# Patient Record
Sex: Female | Born: 1965 | Race: Black or African American | Hispanic: No | Smoking: Former smoker
Health system: Southern US, Community
[De-identification: ages and names within clinical notes are randomized; demographics above are authoritative.]

## PROBLEM LIST (undated history)

## (undated) DIAGNOSIS — J439 Emphysema, unspecified: Secondary | ICD-10-CM

## (undated) DIAGNOSIS — J45909 Unspecified asthma, uncomplicated: Secondary | ICD-10-CM

## (undated) HISTORY — PX: FOOT SURGERY: SHX648

---

## 2009-08-27 ENCOUNTER — Emergency Department (HOSPITAL_COMMUNITY): Admission: EM | Admit: 2009-08-27 | Discharge: 2009-08-27 | Payer: Self-pay | Admitting: Emergency Medicine

## 2010-07-11 ENCOUNTER — Encounter: Admission: RE | Admit: 2010-07-11 | Discharge: 2010-07-11 | Payer: Self-pay | Admitting: Family Medicine

## 2010-08-15 ENCOUNTER — Encounter: Admission: RE | Admit: 2010-08-15 | Discharge: 2010-08-15 | Payer: Self-pay | Admitting: Family Medicine

## 2010-09-03 ENCOUNTER — Encounter
Admission: RE | Admit: 2010-09-03 | Discharge: 2010-09-03 | Payer: Self-pay | Source: Home / Self Care | Attending: Family Medicine | Admitting: Family Medicine

## 2010-10-05 ENCOUNTER — Encounter: Payer: Self-pay | Admitting: Family Medicine

## 2010-12-03 ENCOUNTER — Encounter: Payer: Self-pay | Admitting: Family Medicine

## 2010-12-17 LAB — GC/CHLAMYDIA PROBE AMP, GENITAL: GC Probe Amp, Genital: NEGATIVE

## 2010-12-17 LAB — URINALYSIS, ROUTINE W REFLEX MICROSCOPIC
Bilirubin Urine: NEGATIVE
Glucose, UA: NEGATIVE mg/dL
Hgb urine dipstick: NEGATIVE
Protein, ur: NEGATIVE mg/dL
Urobilinogen, UA: 0.2 mg/dL (ref 0.0–1.0)

## 2010-12-17 LAB — WET PREP, GENITAL: Yeast Wet Prep HPF POC: NONE SEEN

## 2010-12-31 ENCOUNTER — Ambulatory Visit: Payer: Self-pay | Admitting: Hematology & Oncology

## 2011-01-17 ENCOUNTER — Ambulatory Visit: Payer: Self-pay | Admitting: Hematology & Oncology

## 2011-01-28 ENCOUNTER — Ambulatory Visit: Payer: Self-pay | Admitting: Hematology & Oncology

## 2011-02-26 ENCOUNTER — Ambulatory Visit (HOSPITAL_BASED_OUTPATIENT_CLINIC_OR_DEPARTMENT_OTHER): Payer: Non-veteran care | Admitting: Hematology & Oncology

## 2011-02-26 ENCOUNTER — Other Ambulatory Visit: Payer: Self-pay | Admitting: Hematology & Oncology

## 2011-02-26 DIAGNOSIS — D72819 Decreased white blood cell count, unspecified: Secondary | ICD-10-CM

## 2011-02-26 LAB — CBC WITH DIFFERENTIAL (CANCER CENTER ONLY)
BASO%: 0.7 % (ref 0.0–2.0)
Eosinophils Absolute: 0 10*3/uL (ref 0.0–0.5)
LYMPH%: 30 % (ref 14.0–48.0)
MCH: 32.2 pg (ref 26.0–34.0)
MCV: 94 fL (ref 81–101)
MONO%: 7.9 % (ref 0.0–13.0)
NEUT#: 1.8 10*3/uL (ref 1.5–6.5)
Platelets: 255 10*3/uL (ref 145–400)
RBC: 3.79 10*6/uL (ref 3.70–5.32)
RDW: 14.3 % (ref 11.1–15.7)
WBC: 3 10*3/uL — ABNORMAL LOW (ref 3.9–10.0)

## 2011-02-26 LAB — CHCC SATELLITE - SMEAR

## 2011-02-28 LAB — ANA: Anti Nuclear Antibody(ANA): NEGATIVE

## 2011-06-26 ENCOUNTER — Other Ambulatory Visit: Payer: Self-pay | Admitting: Hematology & Oncology

## 2011-06-26 ENCOUNTER — Encounter (HOSPITAL_BASED_OUTPATIENT_CLINIC_OR_DEPARTMENT_OTHER): Payer: Non-veteran care | Admitting: Hematology & Oncology

## 2011-06-26 DIAGNOSIS — D72819 Decreased white blood cell count, unspecified: Secondary | ICD-10-CM

## 2011-06-26 LAB — CBC WITH DIFFERENTIAL (CANCER CENTER ONLY)
BASO#: 0 10*3/uL (ref 0.0–0.2)
Eosinophils Absolute: 0.1 10*3/uL (ref 0.0–0.5)
HGB: 12.4 g/dL (ref 11.6–15.9)
LYMPH#: 1.3 10*3/uL (ref 0.9–3.3)
LYMPH%: 23 % (ref 14.0–48.0)
MCH: 33.6 pg (ref 26.0–34.0)
MCHC: 35.1 g/dL (ref 32.0–36.0)
NEUT#: 3.7 10*3/uL (ref 1.5–6.5)
NEUT%: 67.4 % (ref 39.6–80.0)
Platelets: 244 10*3/uL (ref 145–400)
RDW: 13.9 % (ref 11.1–15.7)

## 2014-07-04 ENCOUNTER — Emergency Department (HOSPITAL_COMMUNITY): Payer: Worker's Compensation

## 2014-07-04 ENCOUNTER — Emergency Department (HOSPITAL_COMMUNITY)
Admission: EM | Admit: 2014-07-04 | Discharge: 2014-07-04 | Disposition: A | Discharge status: Home / Self Care | Payer: Worker's Compensation | Attending: Emergency Medicine | Admitting: Emergency Medicine

## 2014-07-04 ENCOUNTER — Encounter (HOSPITAL_COMMUNITY): Payer: Self-pay | Admitting: Emergency Medicine

## 2014-07-04 DIAGNOSIS — Z720 Tobacco use: Secondary | ICD-10-CM | POA: Diagnosis not present

## 2014-07-04 DIAGNOSIS — S6991XA Unspecified injury of right wrist, hand and finger(s), initial encounter: Secondary | ICD-10-CM | POA: Diagnosis present

## 2014-07-04 DIAGNOSIS — Y9389 Activity, other specified: Secondary | ICD-10-CM | POA: Diagnosis not present

## 2014-07-04 DIAGNOSIS — Z79899 Other long term (current) drug therapy: Secondary | ICD-10-CM | POA: Insufficient documentation

## 2014-07-04 DIAGNOSIS — S61011A Laceration without foreign body of right thumb without damage to nail, initial encounter: Secondary | ICD-10-CM

## 2014-07-04 DIAGNOSIS — W231XXA Caught, crushed, jammed, or pinched between stationary objects, initial encounter: Secondary | ICD-10-CM | POA: Insufficient documentation

## 2014-07-04 DIAGNOSIS — Y998 Other external cause status: Secondary | ICD-10-CM | POA: Insufficient documentation

## 2014-07-04 DIAGNOSIS — Y9269 Other specified industrial and construction area as the place of occurrence of the external cause: Secondary | ICD-10-CM | POA: Insufficient documentation

## 2014-07-04 LAB — RAPID URINE DRUG SCREEN, HOSP PERFORMED
Amphetamines: NOT DETECTED
Barbiturates: NOT DETECTED
Benzodiazepines: NOT DETECTED
COCAINE: NOT DETECTED
OPIATES: NOT DETECTED
TETRAHYDROCANNABINOL: NOT DETECTED

## 2014-07-04 MED ORDER — HYDROCODONE-ACETAMINOPHEN 5-325 MG PO TABS: 2.0000 | ORAL_TABLET | ORAL | Status: DC | PRN

## 2014-07-04 MED ORDER — LIDOCAINE-EPINEPHRINE 2 %-1:100000 IJ SOLN: 1.7000 mL | Freq: Once | INTRAMUSCULAR | Status: DC

## 2014-07-04 MED ORDER — LIDOCAINE-EPINEPHRINE 1 %-1:100000 IJ SOLN
1.7000 mL | Freq: Once | INTRAMUSCULAR | Status: AC
  Administered 2014-07-04: 1.7 mL via INTRADERMAL
  Filled 2014-07-04: qty 1

## 2014-07-04 MED ORDER — LIDOCAINE HCL (CARDIAC) 20 MG/ML IV SOLN
INTRAVENOUS | Status: DC
Start: 2014-07-04 — End: 2014-07-04
  Filled 2014-07-04: qty 5

## 2014-07-04 NOTE — ED Provider Notes (Signed)
CSN: [id number]     Arrival date & time 07/04/14  0235 History   First MD Initiated Contact with Patient 07/04/14 0407     Chief Complaint  Patient presents with  . Finger Injury     (Consider location/radiation/quality/duration/timing/severity/associated sxs/prior Treatment) HPI 48 year old female presents to the emergency department from her place of work with right thumb injury.  Patient reports that a hook-like Radic device that is in the machinery caught her thumb.  She reports her tetanus is up-to-date. History reviewed. No pertinent past medical history. Past Surgical History  Procedure Laterality Date  . Cesarean section    . Foot surgery     No family history on file. History  Substance Use Topics  . Smoking status: Current Every Day Smoker  . Smokeless tobacco: Not on file  . Alcohol Use: No   OB History   Grav Para Term Preterm Abortions TAB SAB Ect Mult Living                 Review of Systems  See History of Present Illness; otherwise all other systems are reviewed and negative   Allergies  Review of patient's allergies indicates no known allergies.  Home Medications   Prior to Admission medications   Medication Sig Start Date End Date Taking? Authorizing Provider  ibuprofen (ADVIL,MOTRIN) 800 MG tablet Take 800 mg by mouth every 8 (eight) hours as needed for moderate pain.   Yes Historical Provider, MD  Multiple Vitamins-Minerals (ALIVE WOMENS 50+ PO) Take 1 tablet by mouth daily.   Yes Historical Provider, MD  HYDROcodone-acetaminophen (NORCO/VICODIN) 5-325 MG per tablet Take 2 tablets by mouth every 4 (four) hours as needed for moderate pain or severe pain. 07/04/14   Olivia Mackielga M Demaris Leavell, MD   BP 116/75  Pulse 76  Temp(Src) 98.2 F (36.8 C) (Oral)  Resp 18  Ht 5\' 6"  (1.676 m)  Wt 155 lb (70.308 kg)  BMI 25.03 kg/m2  SpO2 100%  LMP 06/21/2014 Physical Exam  Nursing note and vitals reviewed. Constitutional: She appears well-developed and  well-nourished. No distress.  Musculoskeletal: Normal range of motion. She exhibits tenderness. She exhibits no edema.  The patient has a 2 cm flap of old shunt to the pad of her right thumb.  Bleeding is controlled.  Sensation is normal.  Range of motion is normal.  Refill is less than 2 seconds    ED Course  Procedures (including critical care time) Labs Review Labs Reviewed  URINE RAPID DRUG SCREEN (HOSP PERFORMED)    Imaging Review Dg Finger Thumb Right  07/04/2014   CLINICAL DATA:  Jamming injury to the right thumb. Pain around the IP joint.  EXAM: RIGHT THUMB 2+V  COMPARISON:  None.  FINDINGS: There is no evidence of fracture or dislocation. There is no evidence of arthropathy or other focal bone abnormality. Soft tissues are unremarkable  IMPRESSION: Negative.   Electronically Signed   By: Burman NievesWilliam  Stevens M.D.   On: 07/04/2014 03:14     EKG Interpretation None     LACERATION REPAIR Performed by: Olivia MackieTTER,Sehaj Kolden M Authorized by: Olivia MackieTTER,Darrelle Barrell M Consent: Verbal consent obtained. Risks and benefits: risks, benefits and alternatives were discussed Consent given by: patient Patient identity confirmed: provided demographic data Prepped and Draped in normal sterile fashion Wound explored  Laceration Location: Right palmar thumb  Laceration Length:2cm  No Foreign Bodies seen or palpated  Anesthesia: local infiltration  Local anesthetic: lidocaine2% with epinephrine  Anesthetic total: 2 ml  Irrigation method: syringe Amount  of cleaning: standard  Skin closure: 5.0 Vicryl   Number of sutures: 6   Technique: Simple interrupted   Patient tolerance: Patient tolerated the procedure well with no immediate complications.  MDM   Final diagnoses:  Thumb laceration, right, initial encounter    48 year old female with right thumb laceration.  Patient had a flap-like avulsion.  Avulsion tacked down with sutures.  Patient advised that the avulsion flap may not be viable, but  will be a good bandage for the underlying abdominal.  She is to return to the ER for suture removal if they have not fallen on her own in 5-7 days.    Olivia Mackielga M Gillie Crisci, MD 07/04/14 [phone]

## 2014-07-04 NOTE — ED Notes (Signed)
Pt. 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

## 2014-07-04 NOTE — Discharge Instructions (Signed)

## 2014-07-04 NOTE — ED Notes (Signed)
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

## 2014-07-10 ENCOUNTER — Encounter (HOSPITAL_COMMUNITY): Payer: Self-pay | Admitting: Emergency Medicine

## 2014-07-10 ENCOUNTER — Emergency Department (HOSPITAL_COMMUNITY)
Admission: EM | Admit: 2014-07-10 | Discharge: 2014-07-10 | Disposition: A | Discharge status: Home / Self Care | Payer: Worker's Compensation | Attending: Emergency Medicine | Admitting: Emergency Medicine

## 2014-07-10 DIAGNOSIS — Y9269 Other specified industrial and construction area as the place of occurrence of the external cause: Secondary | ICD-10-CM | POA: Diagnosis not present

## 2014-07-10 DIAGNOSIS — Y990 Civilian activity done for income or pay: Secondary | ICD-10-CM | POA: Diagnosis not present

## 2014-07-10 DIAGNOSIS — Z720 Tobacco use: Secondary | ICD-10-CM | POA: Diagnosis not present

## 2014-07-10 DIAGNOSIS — W458XXD Other foreign body or object entering through skin, subsequent encounter: Secondary | ICD-10-CM | POA: Insufficient documentation

## 2014-07-10 DIAGNOSIS — Z79899 Other long term (current) drug therapy: Secondary | ICD-10-CM | POA: Diagnosis not present

## 2014-07-10 DIAGNOSIS — S61011D Laceration without foreign body of right thumb without damage to nail, subsequent encounter: Secondary | ICD-10-CM | POA: Insufficient documentation

## 2014-07-10 DIAGNOSIS — M79641 Pain in right hand: Secondary | ICD-10-CM | POA: Diagnosis present

## 2014-07-10 DIAGNOSIS — Y9389 Activity, other specified: Secondary | ICD-10-CM | POA: Diagnosis not present

## 2014-07-10 MED ORDER — CEPHALEXIN 500 MG PO CAPS: 500.0000 mg | ORAL_CAPSULE | Freq: Four times a day (QID) | ORAL | Status: DC

## 2014-07-10 NOTE — ED Notes (Signed)
Pt states repair to R thumb laceration with 6 sutures on 10/20.  She has been placed on light duty, but feels that is still too much for the injury she sustained.  Pain 10/10.  No s/s infection at this time.

## 2014-07-10 NOTE — Discharge Instructions (Signed)
Read the information below.  Use the prescribed medication as directed.  Please discuss all new medications with your pharmacist.  You may return to the Emergency Department at any time for worsening condition or any new symptoms that concern you.  If you develop redness, swelling, pus draining from the wound, or fevers greater than 100.4, return to the ER immediately for a recheck.     Laceration Care, Adult A laceration is a cut that goes through all layers of the skin. The cut goes into the tissue beneath the skin. HOME CARE For stitches (sutures) or staples:  Keep the cut clean and dry.  If you have a bandage (dressing), change it at least once a day. Change the bandage if it gets wet or dirty, or as told by your doctor.  Wash the cut with soap and water 2 times a day. Rinse the cut with water. Pat it dry with a clean towel.  Put a thin layer of medicated cream on the cut as told by your doctor.  You may shower after the first 24 hours. Do not soak the cut in water until the stitches are removed.  Only take medicines as told by your doctor.  Have your stitches or staples removed as told by your doctor. For skin adhesive strips:  Keep the cut clean and dry.  Do not get the strips wet. You may take a bath, but be careful to keep the cut dry.  If the cut gets wet, pat it dry with a clean towel.  The strips will fall off on their own. Do not remove the strips that are still stuck to the cut. For wound glue:  You may shower or take baths. Do not soak or scrub the cut. Do not swim. Avoid heavy sweating until the glue falls off on its own. After a shower or bath, pat the cut dry with a clean towel.  Do not put medicine on your cut until the glue falls off.  If you have a bandage, do not put tape over the glue.  Avoid lots of sunlight or tanning lamps until the glue falls off. Put sunscreen on the cut for the first year to reduce your scar.  The glue will fall off on its own. Do  not pick at the glue. You may need a tetanus shot if:  You cannot remember when you had your last tetanus shot.  You have never had a tetanus shot. If you need a tetanus shot and you choose not to have one, you may get tetanus. Sickness from tetanus can be serious. GET HELP RIGHT AWAY IF:   Your pain does not get better with medicine.  Your arm, hand, leg, or foot loses feeling (numbness) or changes color.  Your cut is bleeding.  Your joint feels weak, or you cannot use your joint.  You have painful lumps on your body.  Your cut is red, puffy (swollen), or painful.  You have a red line on the skin near the cut.  You have yellowish-white fluid (pus) coming from the cut.  You have a fever.  You have a bad smell coming from the cut or bandage.  Your cut breaks open before or after stitches are removed.  You notice something coming out of the cut, such as wood or glass.  You cannot move a finger or toe. MAKE SURE YOU:   Understand these instructions.  Will watch your condition.  Will get help right away if you are  not doing well or get worse. Document Released: 02/18/2008 Document Revised: 11/24/2011 Document Reviewed: 02/25/2011 [facility] Patient Information 2015 [employer] [place], [place]. This information is not intended to replace advice given to you by your health care provider. Make sure you discuss any questions you have with your health care provider.

## 2014-07-10 NOTE — ED Provider Notes (Signed)
CSN: [id number]     Arrival date & time 07/10/14  0703 History   First MD Initiated Contact with Patient 07/10/14 0732     Chief Complaint  Patient presents with  . Hand Pain     (Consider location/radiation/quality/duration/timing/severity/associated sxs/prior Treatment) The history is provided by the patient.   Pt cut her thumb on a piece of equiopment at work on 10/20, has had difficult healing process because her work encouraged her to go back on light duty and she is having to use her hand 12 hours/day, which aggravates her wound.  She puts neosporin and a bandaid on it for her 12 hours shift, cleans with soap and water, and lets it air dry overnight.  Denies fevers, weakness or numbness of the finger.  Denies any change in the appearance except that it is no longer red.     History reviewed. No pertinent past medical history. Past Surgical History  Procedure Laterality Date  . Cesarean section    . Foot surgery     No family history on file. History  Substance Use Topics  . Smoking status: Current Every Day Smoker -- 1.00 packs/day    Types: Cigarettes  . Smokeless tobacco: Not on file  . Alcohol Use: No   OB History   Grav Para Term Preterm Abortions TAB SAB Ect Mult Living                 Review of Systems  Constitutional: Negative for fever.  Musculoskeletal: Negative for arthralgias.  Skin: Positive for wound. Negative for color change.  Allergic/Immunologic: Negative for immunocompromised state.  Neurological: Negative for weakness and numbness.  Hematological: Does not bruise/bleed easily.      Allergies  Review of patient's allergies indicates no known allergies.  Home Medications   Prior to Admission medications   Medication Sig Start Date End Date Taking? Authorizing Provider  HYDROcodone-acetaminophen (NORCO/VICODIN) 5-325 MG per tablet Take 2 tablets by mouth every 4 (four) hours as needed for moderate pain or severe pain. 07/04/14   Olivia Mackielga M Otter,  MD  ibuprofen (ADVIL,MOTRIN) 800 MG tablet Take 800 mg by mouth every 8 (eight) hours as needed for moderate pain.    Historical Provider, MD  Multiple Vitamins-Minerals (ALIVE WOMENS 50+ PO) Take 1 tablet by mouth daily.    Historical Provider, MD   BP 147/86  Pulse 64  Temp(Src) 98 F (36.7 C) (Oral)  Resp 18  Ht 5\' 6"  (1.676 m)  Wt 155 lb (70.308 kg)  BMI 25.03 kg/m2  SpO2 100%  LMP 07/04/2014 Physical Exam  Nursing note and vitals reviewed. Constitutional: She appears well-developed and well-nourished. No distress.  HENT:  Head: Normocephalic and atraumatic.  Neck: Neck supple.  Pulmonary/Chest: Effort normal.  Neurological: She is alert.  Skin: She is not diaphoretic.  Right ventral thumb with sutured laceration intact.  Skin somewhat macerated but no erythema, discharge, or warmth.  Full AROM of thumb, only slightly decreased secondary to edema.  Distal sensation intact, capillary refill < 2 seconds.     ED Course  Procedures (including critical care time) Labs Review Labs Reviewed - No data to display  Imaging Review No results found.   EKG Interpretation None      MDM   Final diagnoses:  Thumb laceration, right, subsequent encounter    Afebrile, nontoxic patient with right thumb laceration returns for wound check.  No e/o infection.  Skin is macerated.  Discussed wound care.  Pt given keflex for coverage  as wound care has not been optimal.  Neurovascularly intact.   D/C home with keflex. PCP follow up.  Discussed result, findings, treatment, and follow up  with patient.  Pt given return precautions.  Pt verbalizes understanding and agrees with plan.         Trixie Dredgemily Rashaun Wichert, PA-C 07/10/14 1326

## 2014-07-10 NOTE — ED Provider Notes (Signed)
Medical screening examination/treatment/procedure(s) were performed by non-physician practitioner and as supervising physician I was immediately available for consultation/collaboration.   EKG Interpretation None       Doug SouSam Naji Mehringer, MD 07/10/14 1728

## 2014-07-10 NOTE — ED Notes (Signed)
Patients thumb was cleaned, and dressed by this RN.

## 2014-07-13 ENCOUNTER — Emergency Department (HOSPITAL_COMMUNITY)
Admission: EM | Admit: 2014-07-13 | Discharge: 2014-07-13 | Disposition: A | Discharge status: Home / Self Care | Payer: Non-veteran care | Attending: Emergency Medicine | Admitting: Emergency Medicine

## 2014-07-13 ENCOUNTER — Encounter (HOSPITAL_COMMUNITY): Payer: Self-pay | Admitting: Emergency Medicine

## 2014-07-13 DIAGNOSIS — Z720 Tobacco use: Secondary | ICD-10-CM | POA: Insufficient documentation

## 2014-07-13 DIAGNOSIS — Z79899 Other long term (current) drug therapy: Secondary | ICD-10-CM | POA: Insufficient documentation

## 2014-07-13 DIAGNOSIS — Z4802 Encounter for removal of sutures: Secondary | ICD-10-CM | POA: Insufficient documentation

## 2014-07-13 NOTE — ED Notes (Signed)
6 sutures removed.

## 2014-07-13 NOTE — ED Provider Notes (Signed)
Medical screening examination/treatment/procedure(s) were performed by non-physician practitioner and as supervising physician I was immediately available for consultation/collaboration.  Flint MelterElliott L Kanyla Omeara, MD 07/13/14 [phone]

## 2014-07-13 NOTE — ED Notes (Signed)
Pt. is here for follow up check up on her right thumb laceration sutured here 3 days ago .

## 2014-07-13 NOTE — ED Provider Notes (Signed)
CSN: [id number]     Arrival date & time 07/13/14  [id number] History   First MD Initiated Contact with Patient 07/13/14 [phone]     Chief Complaint  Patient presents with  . Wound Check     (Consider location/radiation/quality/duration/timing/severity/associated sxs/prior Treatment) Patient is a 48 y.o. female presenting with wound check. The history is provided by the patient. No language interpreter was used.  Wound Check This is a new problem. The current episode started in the past 7 days. The problem occurs constantly. Nothing aggravates the symptoms. The treatment provided no relief.  discolored around sutures,  Possible early infection,  Nv and ns intact  History reviewed. No pertinent past medical history. Past Surgical History  Procedure Laterality Date  . Cesarean section    . Foot surgery     No family history on file. History  Substance Use Topics  . Smoking status: Current Every Day Smoker -- 1.00 packs/day    Types: Cigarettes  . Smokeless tobacco: Not on file  . Alcohol Use: No   OB History   Grav Para Term Preterm Abortions TAB SAB Ect Mult Living                 Review of Systems    Allergies  Review of patient's allergies indicates no known allergies.  Home Medications   Prior to Admission medications   Medication Sig Start Date End Date Taking? Authorizing Provider  cephALEXin (KEFLEX) 500 MG capsule Take 1 capsule (500 mg total) by mouth 4 (four) times daily. 07/10/14  Yes Trixie DredgeEmily West, PA-C  HYDROcodone-acetaminophen (NORCO/VICODIN) 5-325 MG per tablet Take 2 tablets by mouth every 4 (four) hours as needed for moderate pain or severe pain. 07/04/14  Yes Olivia Mackielga M Otter, MD  ibuprofen (ADVIL,MOTRIN) 800 MG tablet Take 800 mg by mouth every 8 (eight) hours as needed for moderate pain.   Yes Historical Provider, MD  Multiple Vitamin (MULTIVITAMIN WITH MINERALS) TABS tablet Take 1 tablet by mouth daily.   Yes Historical Provider, MD   BP 127/68  Pulse 55   Temp(Src) 97.7 F (36.5 C) (Oral)  Resp 14  Ht 5\' 6"  (1.676 m)  Wt 155 lb (70.308 kg)  BMI 25.03 kg/m2  SpO2 100%  LMP 06/21/2014 Physical Exam   ED Course  Procedures (including critical care time) Labs Review Labs Reviewed - No data to display  Imaging Review No results found.   EKG Interpretation None      MDM  Sutures have been in 9 days.   I think pt has had maximin benefit of sutures Sutures removed.    Final diagnoses:  Visit for suture removal    Continue wound care AVS    Elson AreasLeslie K Sofia, PA-C 07/13/14 0819  Elson AreasLeslie K Sofia, PA-C 07/13/14 [phone]

## 2014-07-13 NOTE — Discharge Instructions (Signed)

## 2016-10-20 ENCOUNTER — Encounter (HOSPITAL_COMMUNITY): Payer: Self-pay | Admitting: *Deleted

## 2016-10-20 DIAGNOSIS — F17210 Nicotine dependence, cigarettes, uncomplicated: Secondary | ICD-10-CM | POA: Insufficient documentation

## 2016-10-20 DIAGNOSIS — R200 Anesthesia of skin: Secondary | ICD-10-CM | POA: Diagnosis present

## 2016-10-20 DIAGNOSIS — G629 Polyneuropathy, unspecified: Secondary | ICD-10-CM | POA: Diagnosis not present

## 2016-10-20 LAB — CBC
HCT: 39.4 % (ref 36.0–46.0)
Hemoglobin: 13.6 g/dL (ref 12.0–15.0)
MCH: 33.5 pg (ref 26.0–34.0)
MCHC: 34.5 g/dL (ref 30.0–36.0)
MCV: 97 fL (ref 78.0–100.0)
PLATELETS: 217 10*3/uL (ref 150–400)
RBC: 4.06 MIL/uL (ref 3.87–5.11)
RDW: 12.9 % (ref 11.5–15.5)
WBC: 4 10*3/uL (ref 4.0–10.5)

## 2016-10-20 LAB — BASIC METABOLIC PANEL
Anion gap: 7 (ref 5–15)
BUN: 9 mg/dL (ref 6–20)
CALCIUM: 9 mg/dL (ref 8.9–10.3)
CHLORIDE: 109 mmol/L (ref 101–111)
CO2: 24 mmol/L (ref 22–32)
CREATININE: 0.85 mg/dL (ref 0.44–1.00)
GFR calc Af Amer: >60 mL/min (ref >60)
GFR calc non Af Amer: >60 mL/min (ref >60)
Glucose, Bld: 99 mg/dL (ref 65–99)
Potassium: 3.7 mmol/L (ref 3.5–5.1)
Sodium: 140 mmol/L (ref 135–145)

## 2016-10-20 LAB — CBG MONITORING, ED: Glucose-Capillary: 106 mg/dL — ABNORMAL HIGH (ref 65–99)

## 2016-10-20 NOTE — ED Triage Notes (Signed)
PT states that she feels "cold"; pt states that she has "pins and needles to her feet; states that her rt hand feels cold and ache; pt states that she feels like she cannot get warm; pt states that the feeling of cold woke her up; pt states that she thought it was stress but that she should get evaluated since it has been going on for awhile; no weakness noted; pt with normal sensation to both sides of her body; no slurred speech; pt ambulatory with steady gait

## 2016-10-21 ENCOUNTER — Emergency Department (HOSPITAL_COMMUNITY)
Admission: EM | Admit: 2016-10-21 | Discharge: 2016-10-21 | Disposition: A | Discharge status: Home / Self Care | Payer: Non-veteran care | Attending: Emergency Medicine | Admitting: Emergency Medicine

## 2016-10-21 DIAGNOSIS — G629 Polyneuropathy, unspecified: Secondary | ICD-10-CM

## 2016-10-21 LAB — URINALYSIS, ROUTINE W REFLEX MICROSCOPIC
Bilirubin Urine: NEGATIVE
Glucose, UA: NEGATIVE mg/dL
HGB URINE DIPSTICK: NEGATIVE
Ketones, ur: NEGATIVE mg/dL
Leukocytes, UA: NEGATIVE
NITRITE: NEGATIVE
Protein, ur: NEGATIVE mg/dL
SPECIFIC GRAVITY, URINE: 1.023 (ref 1.005–1.030)
pH: 6 (ref 5.0–8.0)

## 2016-10-21 MED ORDER — GABAPENTIN 100 MG PO CAPS: 100.0000 mg | ORAL_CAPSULE | Freq: Two times a day (BID) | ORAL | 0 refills | Status: DC | PRN

## 2016-10-21 NOTE — Discharge Instructions (Signed)
Read the information below.  Use the prescribed medication as directed.  Please discuss all new medications with your pharmacist.  You may return to the Emergency Department at any time for worsening condition or any new symptoms that concern you.    °

## 2016-10-21 NOTE — ED Provider Notes (Signed)
[facility]-EMERGENCY DEPT Provider Note   CSN: [id number] Arrival date & time: 10/20/16  2147     History   Chief Complaint Chief Complaint  Patient presents with  . Numbness    HPI Nancy Chang is a 51 y.o. female.  HPI   Pt presents with 1 year of bilateral lower extremity tingling, burning, stinging that is painful.  A few months ago she felt like her legs were hot and now they feel cold.  She also has some tingling in her bilateral hands.  The pain is worse with lying down.  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  She is prediabetic.  She smokes.  Denies ETOH use.  No known medical problems.  [facility]  Has tried aspirin and pain medication without improvement.   History reviewed. No pertinent past medical history.  There are no active problems to display for this patient.   Past Surgical History:  Procedure Laterality Date  . CESAREAN SECTION    . FOOT SURGERY      OB History    No data available       Home Medications    Prior to Admission medications   Medication Sig Start Date End Date Taking? Authorizing Provider  cholecalciferol (VITAMIN D) 1000 units tablet Take 1,000 Units by mouth daily.   Yes Historical Provider, MD  guaiFENesin (MUCINEX) 600 MG 12 hr tablet Take 600 mg by mouth 2 (two) times daily.   Yes Historical Provider, MD  ibuprofen (ADVIL,MOTRIN) 800 MG tablet Take 800 mg by mouth every 8 (eight) hours as needed for moderate pain.   Yes Historical Provider, MD  Multiple Vitamin (MULTIVITAMIN WITH MINERALS) TABS tablet Take 1 tablet by mouth daily.   Yes Historical Provider, MD  OVER THE COUNTER MEDICATION Take 30 mLs by mouth once.   Yes Historical Provider, MD  vitamin B-12 (CYANOCOBALAMIN) 1000 MCG tablet Take 1,000 mcg by mouth daily.   Yes Historical Provider, MD  vitamin C (ASCORBIC ACID) 250 MG tablet Take 250 mg by mouth daily.   Yes Historical Provider, MD  cephALEXin (KEFLEX) 500 MG  capsule Take 1 capsule (500 mg total) by mouth 4 (four) times daily. Patient not taking: Reported on 10/21/2016 07/10/14   Trixie Dredge, PA-C  gabapentin (NEURONTIN) 100 MG capsule Take 1 capsule (100 mg total) by mouth 2 (two) times daily as needed (pain). 10/21/16   Trixie Dredge, PA-C  HYDROcodone-acetaminophen (NORCO/VICODIN) 5-325 MG per tablet Take 2 tablets by mouth every 4 (four) hours as needed for moderate pain or severe pain. Patient not taking: Reported on 10/21/2016 07/04/14   Marisa Severin, MD    Family History No family history on file.  Social History Social History  Substance Use Topics  . Smoking status: Current Every Day Smoker    Packs/day: 1.00    Types: Cigarettes  . Smokeless tobacco: Never Used  . Alcohol use No     Allergies   Patient has no known allergies.   Review of Systems Review of Systems  All other systems reviewed and are negative.    Physical Exam Updated Vital Signs BP 107/75 (BP Location: Left Arm)   Pulse (!) 51   Temp 98.6 F (37 C) (Oral)   Resp 16   LMP 07/04/2014   SpO2 100%   Physical Exam  Constitutional: She appears well-developed and well-nourished.  HENT:  Head: Normocephalic and atraumatic.  Neck: Neck supple.  Cardiovascular:  Normal rate and regular rhythm.   Pulmonary/Chest: Effort normal.  Musculoskeletal: She exhibits no edema.  Bilateral lower legs and feet without erythema, edema, warmth, tenderness.  Strong distal pulses.  Color and temperature are normal.  Bilateral hands with normal coloration, normal sensation, full AROM.    Neurological: She is alert.  Nursing note and vitals reviewed.    ED Treatments / Results  Labs (all labs ordered are listed, but only abnormal results are displayed) Labs Reviewed  URINALYSIS, ROUTINE W REFLEX MICROSCOPIC - Abnormal; Notable for the following:       Result Value   Bacteria, UA RARE (*)    Squamous Epithelial / LPF 0-5 (*)    All other components within normal limits  CBG  MONITORING, ED - Abnormal; Notable for the following:    Glucose-Capillary 106 (*)    All other components within normal limits  BASIC METABOLIC PANEL  CBC    EKG  EKG Interpretation None       Radiology No results found.  Procedures Procedures (including critical care time)  Medications Ordered in ED Medications - No data to display   Initial Impression / Assessment and Plan / ED Course  I have reviewed the triage vital signs and the nursing notes.  Pertinent labs & imaging results that were available during my care of the patient were reviewed by me and considered in my medical decision making (see chart for details).  Clinical Course as of Oct 22 335  Tue Oct 21, 2016  0328 Pt checked on DEA database - no prescriptions.   [EW]    Clinical Course User Index [EW] Trixie DredgeEmily Marte Celani, PA-C    Afebrile, nontoxic patient with 1 year of peripheral neuropathy.  She is pre-diabetic and smokes.  No hx ETOH abuse.  No recent changes.  Exam reassuring.  Doubt significant PAD, PVD. No e/o DVT.  D/C home with short supply trial of gabapentin, PCP follow up.  Discussed result, findings, treatment, and follow up  with patient.  Pt given return precautions.  Pt verbalizes understanding and agrees with plan.       Final Clinical Impressions(s) / ED Diagnoses   Final diagnoses:  Neuropathy (HCC)    New Prescriptions New Prescriptions   GABAPENTIN (NEURONTIN) 100 MG CAPSULE    Take 1 capsule (100 mg total) by mouth 2 (two) times daily as needed (pain).     Trixie Dredgemily Tamyrah Burbage, PA-C 10/21/16 [phone]    Dione Boozeavid Glick, MD 10/21/16 [phone]

## 2017-06-20 ENCOUNTER — Encounter (HOSPITAL_COMMUNITY): Payer: Self-pay | Admitting: Emergency Medicine

## 2017-06-20 ENCOUNTER — Emergency Department (HOSPITAL_COMMUNITY)
Admission: EM | Admit: 2017-06-20 | Discharge: 2017-06-20 | Disposition: A | Discharge status: Home / Self Care | Payer: Non-veteran care | Attending: Emergency Medicine | Admitting: Emergency Medicine

## 2017-06-20 DIAGNOSIS — F17210 Nicotine dependence, cigarettes, uncomplicated: Secondary | ICD-10-CM | POA: Insufficient documentation

## 2017-06-20 DIAGNOSIS — Z113 Encounter for screening for infections with a predominantly sexual mode of transmission: Secondary | ICD-10-CM | POA: Diagnosis present

## 2017-06-20 LAB — URINALYSIS, ROUTINE W REFLEX MICROSCOPIC
BILIRUBIN URINE: NEGATIVE
GLUCOSE, UA: NEGATIVE mg/dL
Hgb urine dipstick: NEGATIVE
KETONES UR: NEGATIVE mg/dL
Leukocytes, UA: NEGATIVE
Nitrite: NEGATIVE
Protein, ur: NEGATIVE mg/dL
Specific Gravity, Urine: 1.025 (ref 1.005–1.030)
pH: 5 (ref 5.0–8.0)

## 2017-06-20 LAB — WET PREP, GENITAL
Sperm: NONE SEEN
Trich, Wet Prep: NONE SEEN
Yeast Wet Prep HPF POC: NONE SEEN

## 2017-06-20 LAB — PREGNANCY, URINE: Preg Test, Ur: NEGATIVE

## 2017-06-20 NOTE — ED Provider Notes (Signed)
[facility]-EMERGENCY DEPT Provider Note   CSN: [id number] Arrival date & time: 06/20/17  0224     History   Chief Complaint Chief Complaint  Patient presents with  . STD check    HPI Nancy Chang is a 51 y.o. female.  Patient presents to the ED with a chief complaint of requesting STD check.  She states that her partner told her she needed to be evaluated.  She denies any vaginal discharge, bleeding, or dysuria, but does states that it feels like her bladder is full.  She denies any fevers, chills, nausea, vomiting, or diarrhea.  There are no other associated symptoms or modifying factors.   The history is provided by the patient. No language interpreter was used.    History reviewed. No pertinent past medical history.  There are no active problems to display for this patient.   Past Surgical History:  Procedure Laterality Date  . CESAREAN SECTION    . FOOT SURGERY      OB History    No data available       Home Medications    Prior to Admission medications   Medication Sig Start Date End Date Taking? Authorizing Provider  cephALEXin (KEFLEX) 500 MG capsule Take 1 capsule (500 mg total) by mouth 4 (four) times daily. Patient not taking: Reported on 10/21/2016 07/10/14   Trixie Dredge, PA-C  cholecalciferol (VITAMIN D) 1000 units tablet Take 1,000 Units by mouth daily.    [provider]  gabapentin (NEURONTIN) 100 MG capsule Take 1 capsule (100 mg total) by mouth 2 (two) times daily as needed (pain). 10/21/16   Trixie Dredge, PA-C  guaiFENesin (MUCINEX) 600 MG 12 hr tablet Take 600 mg by mouth 2 (two) times daily.    [provider]  HYDROcodone-acetaminophen (NORCO/VICODIN) 5-325 MG per tablet Take 2 tablets by mouth every 4 (four) hours as needed for moderate pain or severe pain. Patient not taking: Reported on 10/21/2016 07/04/14   Marisa Severin, MD  ibuprofen (ADVIL,MOTRIN) 800 MG tablet Take 800 mg by mouth every 8 (eight) hours as needed for moderate pain.     [provider]  Multiple Vitamin (MULTIVITAMIN WITH MINERALS) TABS tablet Take 1 tablet by mouth daily.    [provider]  OVER THE COUNTER MEDICATION Take 30 mLs by mouth once.    [provider]  vitamin B-12 (CYANOCOBALAMIN) 1000 MCG tablet Take 1,000 mcg by mouth daily.    [provider]  vitamin C (ASCORBIC ACID) 250 MG tablet Take 250 mg by mouth daily.    [provider]    Family History No family history on file.  Social History Social History  Substance Use Topics  . Smoking status: Current Every Day Smoker    Packs/day: 1.00    Types: Cigarettes  . Smokeless tobacco: Never Used  . Alcohol use No     Allergies   Patient has no known allergies.   Review of Systems Review of Systems  All other systems reviewed and are negative.    Physical Exam Updated Vital Signs BP 124/84 (BP Location: Right Arm)   Pulse 75   Temp 97.8 F (36.6 C) (Oral)   Resp 18   Ht  (1.676 m)   Wt 77.1 kg (170 lb)   LMP 07/04/2014   SpO2 100%   BMI 27.44 kg/m   Physical Exam  Constitutional: She is oriented to person, place, and time. She appears well-developed and well-nourished.  HENT:  Head:  Normocephalic and atraumatic.  Eyes: Pupils are equal, round, and reactive to light. Conjunctivae and EOM are normal.  Neck: Normal range of motion. Neck supple.  Cardiovascular: Normal rate and regular rhythm.  Exam reveals no gallop and no friction rub.   No murmur heard. Pulmonary/Chest: Effort normal and breath sounds normal. No respiratory distress. She has no wheezes. She has no rales. She exhibits no tenderness.  Abdominal: Soft. Bowel sounds are normal. She exhibits no distension and no mass. There is no tenderness. There is no rebound and no guarding.  Genitourinary:  Genitourinary Comments: Pelvic exam chaperoned by female ER tech, no right or left adnexal tenderness, no uterine tenderness, no vaginal discharge, no bleeding, no  CMT or friability, no foreign body, no injury to the external genitalia, no other significant findings   Musculoskeletal: Normal range of motion. She exhibits no edema or tenderness.  Neurological: She is alert and oriented to person, place, and time.  Skin: Skin is warm and dry.  Psychiatric: She has a normal mood and affect. Her behavior is normal. Judgment and thought content normal.  Nursing note and vitals reviewed.    ED Treatments / Results  Labs (all labs ordered are listed, but only abnormal results are displayed) Labs Reviewed  WET PREP, GENITAL  URINALYSIS, ROUTINE W REFLEX MICROSCOPIC  PREGNANCY, URINE  GC/CHLAMYDIA PROBE AMP ([place]) NOT AT [facility]    EKG  EKG Interpretation None       Radiology No results found.  Procedures Procedures (including critical care time)  Medications Ordered in ED Medications - No data to display   Initial Impression / Assessment and Plan / ED Course  I have reviewed the triage vital signs and the nursing notes.  Pertinent labs & imaging results that were available during my care of the patient were reviewed by me and considered in my medical decision making (see chart for details).     Patient here for STD check.  Asymptomatic.  Offered treatment in case of positive test results, but patient states she would rather wait for the results.  VSS.  PCP follow-up.  Final Clinical Impressions(s) / ED Diagnoses   Final diagnoses:  Screening for STDs (sexually transmitted diseases)    New Prescriptions New Prescriptions   No medications on file     Roxy Horseman, Cordelia Poche 06/20/17 0329    Dione Booze, MD 06/20/17 [phone]

## 2017-06-20 NOTE — ED Triage Notes (Signed)
Per patient partner told she needed to get checked for STD.  Only symptom is bladder feel "fuller".

## 2017-06-22 LAB — GC/CHLAMYDIA PROBE AMP (~~LOC~~) NOT AT ARMC
Chlamydia: NEGATIVE
NEISSERIA GONORRHEA: NEGATIVE

## 2017-12-05 ENCOUNTER — Emergency Department (HOSPITAL_BASED_OUTPATIENT_CLINIC_OR_DEPARTMENT_OTHER)
Admission: EM | Admit: 2017-12-05 | Discharge: 2017-12-05 | Disposition: A | Discharge status: Home / Self Care | Payer: Non-veteran care | Attending: Emergency Medicine | Admitting: Emergency Medicine

## 2017-12-05 ENCOUNTER — Encounter (HOSPITAL_BASED_OUTPATIENT_CLINIC_OR_DEPARTMENT_OTHER): Payer: Self-pay | Admitting: Emergency Medicine

## 2017-12-05 ENCOUNTER — Other Ambulatory Visit: Payer: Self-pay

## 2017-12-05 DIAGNOSIS — M2550 Pain in unspecified joint: Secondary | ICD-10-CM | POA: Diagnosis not present

## 2017-12-05 DIAGNOSIS — F17210 Nicotine dependence, cigarettes, uncomplicated: Secondary | ICD-10-CM | POA: Diagnosis not present

## 2017-12-05 DIAGNOSIS — R200 Anesthesia of skin: Secondary | ICD-10-CM | POA: Diagnosis not present

## 2017-12-05 DIAGNOSIS — Z79899 Other long term (current) drug therapy: Secondary | ICD-10-CM | POA: Diagnosis not present

## 2017-12-05 DIAGNOSIS — M791 Myalgia, unspecified site: Secondary | ICD-10-CM

## 2017-12-05 DIAGNOSIS — J45909 Unspecified asthma, uncomplicated: Secondary | ICD-10-CM | POA: Insufficient documentation

## 2017-12-05 DIAGNOSIS — R600 Localized edema: Secondary | ICD-10-CM | POA: Diagnosis not present

## 2017-12-05 HISTORY — DX: Unspecified asthma, uncomplicated: J45.909

## 2017-12-05 LAB — CBC WITH DIFFERENTIAL/PLATELET
BASOS ABS: 0 10*3/uL (ref 0.0–0.1)
Basophils Relative: 1 %
EOS PCT: 3 %
Eosinophils Absolute: 0.1 10*3/uL (ref 0.0–0.7)
HEMATOCRIT: 41 % (ref 36.0–46.0)
Hemoglobin: 14 g/dL (ref 12.0–15.0)
LYMPHS ABS: 1.2 10*3/uL (ref 0.7–4.0)
Lymphocytes Relative: 30 %
MCH: 33.5 pg (ref 26.0–34.0)
MCHC: 34.1 g/dL (ref 30.0–36.0)
MCV: 98.1 fL (ref 78.0–100.0)
Monocytes Absolute: 0.4 10*3/uL (ref 0.1–1.0)
Monocytes Relative: 9 %
NEUTROS ABS: 2.3 10*3/uL (ref 1.7–7.7)
Neutrophils Relative %: 57 %
PLATELETS: 207 10*3/uL (ref 150–400)
RBC: 4.18 MIL/uL (ref 3.87–5.11)
RDW: 13.1 % (ref 11.5–15.5)
WBC: 3.9 10*3/uL — AB (ref 4.0–10.5)

## 2017-12-05 LAB — URINALYSIS, ROUTINE W REFLEX MICROSCOPIC
Bilirubin Urine: NEGATIVE
Glucose, UA: NEGATIVE mg/dL
Hgb urine dipstick: NEGATIVE
KETONES UR: NEGATIVE mg/dL
LEUKOCYTES UA: NEGATIVE
NITRITE: NEGATIVE
Protein, ur: NEGATIVE mg/dL
pH: 5.5 (ref 5.0–8.0)

## 2017-12-05 LAB — COMPREHENSIVE METABOLIC PANEL
ALK PHOS: 89 U/L (ref 38–126)
ALT: 14 U/L (ref 14–54)
ANION GAP: 6 (ref 5–15)
AST: 19 U/L (ref 15–41)
Albumin: 3.9 g/dL (ref 3.5–5.0)
BUN: 10 mg/dL (ref 6–20)
CO2: 23 mmol/L (ref 22–32)
Calcium: 9 mg/dL (ref 8.9–10.3)
Chloride: 111 mmol/L (ref 101–111)
Creatinine, Ser: 0.76 mg/dL (ref 0.44–1.00)
GFR calc Af Amer: >60 mL/min (ref >60)
GFR calc non Af Amer: >60 mL/min (ref >60)
Glucose, Bld: 87 mg/dL (ref 65–99)
POTASSIUM: 3.7 mmol/L (ref 3.5–5.1)
Sodium: 140 mmol/L (ref 135–145)
Total Bilirubin: 0.4 mg/dL (ref 0.3–1.2)
Total Protein: 6.9 g/dL (ref 6.5–8.1)

## 2017-12-05 LAB — SEDIMENTATION RATE: Sed Rate: 4 mm/hr (ref 0–22)

## 2017-12-05 LAB — CK: Total CK: 62 U/L (ref 38–234)

## 2017-12-05 LAB — TSH: TSH: 0.352 u[IU]/mL (ref 0.350–4.500)

## 2017-12-05 MED ORDER — TRAMADOL HCL 50 MG PO TABS
100.0000 mg | ORAL_TABLET | Freq: Once | ORAL | Status: AC
  Administered 2017-12-05: 100 mg via ORAL
  Filled 2017-12-05: qty 2

## 2017-12-05 MED ORDER — HYDROCODONE-ACETAMINOPHEN 5-325 MG PO TABS: 1.0000 | ORAL_TABLET | ORAL | 0 refills | Status: DC | PRN

## 2017-12-05 MED ORDER — DEXAMETHASONE SODIUM PHOSPHATE 10 MG/ML IJ SOLN
10.0000 mg | Freq: Once | INTRAMUSCULAR | Status: AC
  Administered 2017-12-05: 10 mg via INTRAMUSCULAR
  Filled 2017-12-05: qty 1

## 2017-12-05 MED ORDER — PREDNISONE 20 MG PO TABS: ORAL_TABLET | ORAL | 0 refills | Status: DC

## 2017-12-05 NOTE — Discharge Instructions (Signed)
1.  See your primary care provider at the [facility].  Discussed possible referral to a rheumatologist. 2.  You must quit smoking.  This could seriously worsen problems you are having with cold and hot episodes of your hands.

## 2017-12-05 NOTE — ED Provider Notes (Signed)
[facility] EMERGENCY DEPARTMENT Provider Note   CSN: [id number] Arrival date & time: 12/05/17  1124     History   Chief Complaint Chief Complaint  Patient presents with  . Joint Pain    HPI Nancy Chang is a 52 y.o. female.  HPI Patient reports she gets episodes of sharp pains that can occur in her hands or her feet.  Sometimes these radiate up into her extremities.  She also has episodes where her hands get very cold and numb.  At other times they are warm and red.  She experiences a lot of sharp and aching pains in her extremities.  No fevers, no chills.  No chest pain no shortness of breath.  No confusion.  Patient does smoke.  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  Patient reports in the past she was once told that she had neuropathy but she describes having had an EMG and then told that she did not have neuropathy. Past Medical History:  Diagnosis Date  . Asthma     There are no active problems to display for this patient.   Past Surgical History:  Procedure Laterality Date  . CESAREAN SECTION    . FOOT SURGERY       OB History   None      Home Medications    Prior to Admission medications   Medication Sig Start Date End Date Taking? Authorizing Provider  cephALEXin (KEFLEX) 500 MG capsule Take 1 capsule (500 mg total) by mouth 4 (four) times daily. Patient not taking: Reported on 10/21/2016 07/10/14   Trixie DredgeWest, Emily, PA-C  cholecalciferol (VITAMIN D) 1000 units tablet Take 1,000 Units by mouth daily.    [provider]  gabapentin (NEURONTIN) 100 MG capsule Take 1 capsule (100 mg total) by mouth 2 (two) times daily as needed (pain). 10/21/16   Trixie DredgeWest, Emily, PA-C  guaiFENesin (MUCINEX) 600 MG 12 hr tablet Take 600 mg by mouth 2 (two) times daily.    [provider]  HYDROcodone-acetaminophen (NORCO/VICODIN) 5-325 MG per tablet Take 2 tablets by mouth every 4 (four) hours as needed for moderate pain  or severe pain. Patient not taking: Reported on 10/21/2016 07/04/14   Marisa Severintter, Olga, MD  HYDROcodone-acetaminophen (NORCO/VICODIN) 5-325 MG tablet Take 1 tablet by mouth every 4 (four) hours as needed for moderate pain or severe pain. 12/05/17   Arby BarrettePfeiffer, Angeleen Horney, MD  ibuprofen (ADVIL,MOTRIN) 800 MG tablet Take 800 mg by mouth every 8 (eight) hours as needed for moderate pain.    [provider]  Multiple Vitamin (MULTIVITAMIN WITH MINERALS) TABS tablet Take 1 tablet by mouth daily.    [provider]  OVER THE COUNTER MEDICATION Take 30 mLs by mouth once.    [provider]  predniSONE (DELTASONE) 20 MG tablet 2 tabs po daily x 4 days 12/05/17   Arby BarrettePfeiffer, Sarin [place], MD  vitamin B-12 (CYANOCOBALAMIN) 1000 MCG tablet Take 1,000 mcg by mouth daily.    [provider]  vitamin C (ASCORBIC ACID) 250 MG tablet Take 250 mg by mouth daily.    [provider]    Family History No family history on file.  Social History Social History   Tobacco Use  . Smoking status: Current Every Day Smoker    Packs/day: 1.00    Types: Cigarettes  . Smokeless tobacco: Never Used  Substance Use Topics  . Alcohol use: No  . Drug use: No  Allergies   Patient has no known allergies.   Review of Systems Review of Systems 10 Systems reviewed and are negative for acute change except as noted in the HPI.   Physical Exam Updated Vital Signs BP 139/78 (BP Location: Right Arm)   Pulse (!) 58   Temp 98.1 F (36.7 C) (Oral)   Resp 16   Ht 5\' 6"  (1.676 m)   Wt 77.1 kg (170 lb)   LMP 07/04/2014   SpO2 100%   BMI 27.44 kg/m   Physical Exam  Constitutional: She is oriented to person, place, and time. She appears well-developed and well-nourished. No distress.  HENT:  Head: Normocephalic and atraumatic.  Nose: Nose normal.  Mouth/Throat: Oropharynx is clear and moist.  Eyes: Pupils are equal, round, and reactive to light. Conjunctivae and EOM are normal.  Neck: Neck  supple. No thyromegaly present.  Cardiovascular: Normal rate and regular rhythm.  No murmur heard. Pulmonary/Chest: Effort normal and breath sounds normal. No respiratory distress.  Abdominal: Soft. She exhibits no distension. There is no tenderness. There is no guarding.  Genitourinary:  Genitourinary Comments: Bilateral hands are slightly swollen.  Patient has erythematous quality to her palmar surfaces and digits.  Skin of the fingers seems slightly shiny and thinned.  There is no edema of the arms.  This is symmetric on both hands.  Feet this is less pronounced.  He does not have any actual edema of the feet.  Soles are slightly shiny and erythematous.  Musculoskeletal: Normal range of motion. She exhibits edema.  Lymphadenopathy:    She has no cervical adenopathy.  Neurological: She is alert and oriented to person, place, and time. No cranial nerve deficit. She exhibits normal muscle tone. Coordination normal.  Skin: Skin is warm and dry.  Psychiatric: She has a normal mood and affect.  Nursing note and vitals reviewed.    ED Treatments / Results  Labs (all labs ordered are listed, but only abnormal results are displayed) Labs Reviewed  CBC WITH DIFFERENTIAL/PLATELET - Abnormal; Notable for the following components:      Result Value   WBC 3.9 (*)    All other components within normal limits  URINALYSIS, ROUTINE W REFLEX MICROSCOPIC - Abnormal; Notable for the following components:   APPearance CLOUDY (*)    Specific Gravity, Urine >1.030 (*)    All other components within normal limits  COMPREHENSIVE METABOLIC PANEL  SEDIMENTATION RATE  CK  TSH    EKG None  Radiology No results found.  Procedures Procedures (including critical care time)  Medications Ordered in ED Medications  dexamethasone (DECADRON) injection 10 mg (10 mg Intramuscular Given 12/05/17 1233)  traMADol (ULTRAM) tablet 100 mg (100 mg Oral Given 12/05/17 1233)     Initial Impression / Assessment  and Plan / ED Course  I have reviewed the triage vital signs and the nursing notes.  Pertinent labs & imaging results that were available during my care of the patient were reviewed by me and considered in my medical decision making (see chart for details).     Final Clinical Impressions(s) / ED Diagnoses   Final diagnoses:  Myalgia  Arthralgia, unspecified joint   Patient has complaints of pains that are in all 4 extremities.  Pain quality is sometimes aching and lancinating.  Reports that her hands and feet used to be more numb and tingly but now there are more pain episodes.  Sed rate is normal and CK is normal.  Patient does not have appearance  of general illness.  Hands do have somewhat more of an erythematous and slightly swollen appearance of the digits.  She describes episodes of extreme cold of the hands and then swelling.  Question diagnoses such as Raynaud's or Buerger syndrome.  Patient is a smoker.  Patient is counseled on smoking cessation and how important this is.  I suggest the patient follow up with her primary care provider at the [facility] and discuss evaluation by rheumatology.  This time she is stable for discharge.  Patient will have a short course of prednisone and Vicodin for arthralgia pain. ED Discharge Orders        Ordered    predniSONE (DELTASONE) 20 MG tablet     12/05/17 1536    HYDROcodone-acetaminophen (NORCO/VICODIN) 5-325 MG tablet  Every 4 hours PRN     12/05/17 1536       Arby Barrette, MD 12/05/17 1545

## 2017-12-05 NOTE — ED Triage Notes (Signed)
Pt reports generalized joint pain for "awhile", worse today. Swelling and redness noted to both hands.

## 2018-07-01 ENCOUNTER — Emergency Department (HOSPITAL_COMMUNITY)
Admission: EM | Admit: 2018-07-01 | Discharge: 2018-07-01 | Disposition: A | Discharge status: Home / Self Care | Payer: Non-veteran care | Attending: Emergency Medicine | Admitting: Emergency Medicine

## 2018-07-01 ENCOUNTER — Encounter (HOSPITAL_COMMUNITY): Payer: Self-pay

## 2018-07-01 ENCOUNTER — Other Ambulatory Visit: Payer: Self-pay

## 2018-07-01 DIAGNOSIS — S199XXA Unspecified injury of neck, initial encounter: Secondary | ICD-10-CM | POA: Diagnosis present

## 2018-07-01 DIAGNOSIS — Y999 Unspecified external cause status: Secondary | ICD-10-CM | POA: Insufficient documentation

## 2018-07-01 DIAGNOSIS — Y929 Unspecified place or not applicable: Secondary | ICD-10-CM | POA: Insufficient documentation

## 2018-07-01 DIAGNOSIS — F17210 Nicotine dependence, cigarettes, uncomplicated: Secondary | ICD-10-CM | POA: Insufficient documentation

## 2018-07-01 DIAGNOSIS — S161XXA Strain of muscle, fascia and tendon at neck level, initial encounter: Secondary | ICD-10-CM | POA: Diagnosis not present

## 2018-07-01 DIAGNOSIS — J45909 Unspecified asthma, uncomplicated: Secondary | ICD-10-CM | POA: Diagnosis not present

## 2018-07-01 DIAGNOSIS — Y939 Activity, unspecified: Secondary | ICD-10-CM | POA: Diagnosis not present

## 2018-07-01 DIAGNOSIS — T148XXA Other injury of unspecified body region, initial encounter: Secondary | ICD-10-CM

## 2018-07-01 DIAGNOSIS — Z79899 Other long term (current) drug therapy: Secondary | ICD-10-CM | POA: Insufficient documentation

## 2018-07-01 MED ORDER — IBUPROFEN 800 MG PO TABS: 800.0000 mg | ORAL_TABLET | Freq: Three times a day (TID) | ORAL | 0 refills | Status: AC | PRN

## 2018-07-01 MED ORDER — CYCLOBENZAPRINE HCL 10 MG PO TABS: 10.0000 mg | ORAL_TABLET | Freq: Two times a day (BID) | ORAL | 0 refills | Status: DC | PRN

## 2018-07-01 NOTE — ED Notes (Signed)
D/c reviewed with patient 

## 2018-07-01 NOTE — ED Notes (Signed)
ED Provider at bedside. 
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
## 2018-07-01 NOTE — ED Triage Notes (Signed)
Pt endorses left neck pain and right hip pain intermittent x 1 week. Denies CP or cardiac sx. VSS

## 2018-09-21 ENCOUNTER — Other Ambulatory Visit: Payer: Self-pay

## 2018-09-21 ENCOUNTER — Encounter (HOSPITAL_COMMUNITY): Payer: Self-pay

## 2018-09-21 ENCOUNTER — Emergency Department (HOSPITAL_COMMUNITY)
Admission: EM | Admit: 2018-09-21 | Discharge: 2018-09-21 | Disposition: A | Discharge status: Home / Self Care | Payer: No Typology Code available for payment source | Attending: Emergency Medicine | Admitting: Emergency Medicine

## 2018-09-21 DIAGNOSIS — R1030 Lower abdominal pain, unspecified: Secondary | ICD-10-CM | POA: Diagnosis not present

## 2018-09-21 DIAGNOSIS — R102 Pelvic and perineal pain: Secondary | ICD-10-CM | POA: Diagnosis not present

## 2018-09-21 DIAGNOSIS — J45909 Unspecified asthma, uncomplicated: Secondary | ICD-10-CM | POA: Insufficient documentation

## 2018-09-21 DIAGNOSIS — Z79899 Other long term (current) drug therapy: Secondary | ICD-10-CM | POA: Insufficient documentation

## 2018-09-21 DIAGNOSIS — F17210 Nicotine dependence, cigarettes, uncomplicated: Secondary | ICD-10-CM | POA: Diagnosis not present

## 2018-09-21 DIAGNOSIS — R109 Unspecified abdominal pain: Secondary | ICD-10-CM | POA: Diagnosis present

## 2018-09-21 LAB — URINALYSIS, ROUTINE W REFLEX MICROSCOPIC
BILIRUBIN URINE: NEGATIVE
Glucose, UA: NEGATIVE mg/dL
HGB URINE DIPSTICK: NEGATIVE
Ketones, ur: NEGATIVE mg/dL
LEUKOCYTES UA: NEGATIVE
NITRITE: NEGATIVE
PH: 5 (ref 5.0–8.0)
Protein, ur: NEGATIVE mg/dL
SPECIFIC GRAVITY, URINE: 1.023 (ref 1.005–1.030)

## 2018-09-21 LAB — COMPREHENSIVE METABOLIC PANEL
ALT: 18 U/L (ref 0–44)
AST: 20 U/L (ref 15–41)
Albumin: 4 g/dL (ref 3.5–5.0)
Alkaline Phosphatase: 97 U/L (ref 38–126)
Anion gap: 10 (ref 5–15)
BUN: 10 mg/dL (ref 6–20)
CALCIUM: 9.7 mg/dL (ref 8.9–10.3)
CO2: 25 mmol/L (ref 22–32)
Chloride: 103 mmol/L (ref 98–111)
Creatinine, Ser: 0.85 mg/dL (ref 0.44–1.00)
GFR calc Af Amer: >60 mL/min (ref >60)
GFR calc non Af Amer: >60 mL/min (ref >60)
Glucose, Bld: 89 mg/dL (ref 70–99)
Potassium: 3.5 mmol/L (ref 3.5–5.1)
Sodium: 138 mmol/L (ref 135–145)
Total Bilirubin: 0.7 mg/dL (ref 0.3–1.2)
Total Protein: 7 g/dL (ref 6.5–8.1)

## 2018-09-21 LAB — I-STAT BETA HCG BLOOD, ED (MC, WL, AP ONLY)

## 2018-09-21 LAB — CBC
HCT: 41.5 % (ref 36.0–46.0)
Hemoglobin: 13.8 g/dL (ref 12.0–15.0)
MCH: 33.3 pg (ref 26.0–34.0)
MCHC: 33.3 g/dL (ref 30.0–36.0)
MCV: 100.2 fL — AB (ref 80.0–100.0)
PLATELETS: 228 10*3/uL (ref 150–400)
RBC: 4.14 MIL/uL (ref 3.87–5.11)
RDW: 12.9 % (ref 11.5–15.5)
WBC: 5.3 10*3/uL (ref 4.0–10.5)
nRBC: 0 % (ref 0.0–0.2)

## 2018-09-21 LAB — LIPASE, BLOOD: Lipase: 32 U/L (ref 11–51)

## 2018-09-21 MED ORDER — DOXYCYCLINE HYCLATE 100 MG PO CAPS: 100.0000 mg | ORAL_CAPSULE | Freq: Two times a day (BID) | ORAL | 0 refills | Status: DC

## 2018-09-21 MED ORDER — CEFTRIAXONE SODIUM 250 MG IJ SOLR
250.0000 mg | Freq: Once | INTRAMUSCULAR | Status: AC
  Administered 2018-09-21: 250 mg via INTRAMUSCULAR
  Filled 2018-09-21: qty 250

## 2018-09-21 MED ORDER — LIDOCAINE HCL (PF) 1 % IJ SOLN
INTRAMUSCULAR | Status: AC
  Administered 2018-09-21: 5 mL
  Filled 2018-09-21: qty 5

## 2018-09-21 MED ORDER — AZITHROMYCIN 1 G PO PACK
1.0000 g | PACK | Freq: Once | ORAL | Status: AC
  Administered 2018-09-21: 1 g via ORAL
  Filled 2018-09-21: qty 1

## 2018-09-21 NOTE — ED Provider Notes (Signed)
[facility] Note   CSN: [id number] Arrival date & time: 09/21/18  0003     History   Chief Complaint Chief Complaint  Patient presents with  . Abdominal Pain    HPI Nancy Chang is a 53 y.o. female.  Patient complains of lower abdominal pain for the last few weeks after having intercourse.  No obvious discharge  The history is provided by the patient. No language interpreter was used.  Abdominal Pain  Pain location:  Suprapubic Pain quality: aching   Pain radiates to:  Does not radiate Pain severity:  Moderate Onset quality:  Gradual Timing:  Constant Progression:  Waxing and waning Chronicity:  New Context: not alcohol use   Relieved by:  Nothing Worsened by:  Nothing Associated symptoms: no chest pain, no cough, no diarrhea, no fatigue and no hematuria     Past Medical History:  Diagnosis Date  . Asthma     There are no active problems to display for this patient.   Past Surgical History:  Procedure Laterality Date  . CESAREAN SECTION    . FOOT SURGERY       OB History   No obstetric history on file.      Home Medications    Prior to Admission medications   Medication Sig Start Date End Date Taking? Authorizing Provider  cholecalciferol (VITAMIN D) 1000 units tablet Take 1,000 Units by mouth daily.    [provider]  cyclobenzaprine (FLEXERIL) 10 MG tablet Take 1 tablet (10 mg total) by mouth 2 (two) times daily as needed for muscle spasms. 07/01/18   Fayrene Helperran, Bowie, PA-C  doxycycline (VIBRAMYCIN) 100 MG capsule Take 1 capsule (100 mg total) by mouth 2 (two) times daily. One po bid x 7 days 09/21/18   Bethann BerkshireZammit, Dail Meece, MD  gabapentin (NEURONTIN) 100 MG capsule Take 1 capsule (100 mg total) by mouth 2 (two) times daily as needed (pain). 10/21/16   Trixie DredgeWest, Emily, PA-C  guaiFENesin (MUCINEX) 600 MG 12 hr tablet Take 600 mg by mouth 2 (two) times daily.    [provider]  ibuprofen (ADVIL,MOTRIN)  800 MG tablet Take 1 tablet (800 mg total) by mouth every 8 (eight) hours as needed for moderate pain. 07/01/18   Fayrene Helperran, Bowie, PA-C  Multiple Vitamin (MULTIVITAMIN WITH MINERALS) TABS tablet Take 1 tablet by mouth daily.    [provider]  OVER THE COUNTER MEDICATION Take 30 mLs by mouth once.    [provider]  predniSONE (DELTASONE) 20 MG tablet 2 tabs po daily x 4 days 12/05/17   Arby BarrettePfeiffer, Marcy, MD  vitamin B-12 (CYANOCOBALAMIN) 1000 MCG tablet Take 1,000 mcg by mouth daily.    [provider]  vitamin C (ASCORBIC ACID) 250 MG tablet Take 250 mg by mouth daily.    [provider]    Family History History reviewed. No pertinent family history.  Social History Social History   Tobacco Use  . Smoking status: Current Every Day Smoker    Packs/day: 1.00    Types: Cigarettes  . Smokeless tobacco: Never Used  Substance Use Topics  . Alcohol use: No  . Drug use: No     Allergies   Patient has no known allergies.   Review of Systems Review of Systems  Constitutional: Negative for appetite change and fatigue.  HENT: Negative for congestion, ear discharge and sinus pressure.   Eyes: Negative for discharge.  Respiratory: Negative for cough.   Cardiovascular: Negative for  chest pain.  Gastrointestinal: Positive for abdominal pain. Negative for diarrhea.  Genitourinary: Negative for frequency and hematuria.  Musculoskeletal: Negative for back pain.  Skin: Negative for rash.  Neurological: Negative for seizures and headaches.  Psychiatric/Behavioral: Negative for hallucinations.     Physical Exam Updated Vital Signs BP (!) 151/82 (BP Location: Left Arm)   Pulse 60   Temp 98.2 F (36.8 C) (Oral)   Resp 16   Ht 5\' 6"  (1.676 m)   Wt 74.4 kg   LMP 07/04/2014   SpO2 100%   BMI 26.47 kg/m   Physical Exam Vitals signs reviewed.  Constitutional:      Appearance: She is well-developed.  HENT:     Head: Normocephalic.  Eyes:      General: No scleral icterus.    Conjunctiva/sclera: Conjunctivae normal.  Neck:     Musculoskeletal: Neck supple.     Thyroid: No thyromegaly.  Cardiovascular:     Rate and Rhythm: Normal rate and regular rhythm.     Heart sounds: No murmur. No friction rub. No gallop.   Pulmonary:     Breath sounds: No stridor. No wheezing or rales.  Chest:     Chest wall: No tenderness.  Abdominal:     General: There is no distension.     Tenderness: There is no abdominal tenderness. There is no rebound.  Genitourinary:    Comments: Tender cervix and adnexal area Musculoskeletal: Normal range of motion.  Lymphadenopathy:     Cervical: No cervical adenopathy.  Skin:    Findings: No erythema or rash.  Neurological:     Mental Status: She is oriented to person, place, and time.     Motor: No abnormal muscle tone.     Coordination: Coordination normal.  Psychiatric:        Behavior: Behavior normal.      ED Treatments / Results  Labs (all labs ordered are listed, but only abnormal results are displayed) Labs Reviewed  URINALYSIS, ROUTINE W REFLEX MICROSCOPIC - Abnormal; Notable for the following components:      Result Value   Color, Urine AMBER (*)    APPearance HAZY (*)    Bacteria, UA RARE (*)    All other components within normal limits  CBC - Abnormal; Notable for the following components:   MCV 100.2 (*)    All other components within normal limits  LIPASE, BLOOD  COMPREHENSIVE METABOLIC PANEL  I-STAT BETA HCG BLOOD, ED ([facility], [facility], [facility] ONLY)  GC/CHLAMYDIA PROBE AMP ([place]) NOT AT [facility]    EKG None  Radiology No results found.  Procedures Procedures (including critical care time)  Medications Ordered in ED Medications  cefTRIAXone (ROCEPHIN) injection 250 mg (has no administration in time range)  azithromycin (ZITHROMAX) powder 1 g (has no administration in time range)     Initial Impression / Assessment and Plan / ED Course  I have reviewed the triage vital  signs and the nursing notes.  Pertinent labs & imaging results that were available during my care of the patient were reviewed by me and considered in my medical decision making (see chart for details).    Patient with pelvic pain.  She will be treated empirically for STD and follow-up with women's clinic  Final Clinical Impressions(s) / ED Diagnoses   Final diagnoses:  Lower abdominal pain    ED Discharge Orders         Ordered    doxycycline (VIBRAMYCIN) 100 MG capsule  2 times daily  09/21/18 4166           Bethann Berkshire, MD 09/21/18 [phone]

## 2018-09-21 NOTE — ED Triage Notes (Signed)
Pt arrives POV for eval of lower abd pain onset early December. Pt reports she had intercourse and approx 3 days later started w/ lower abd pain. Pt reports over last month it has progressed to radiation down into vaginal area, w/ dysuria. Pt also endorses radiation up into shoulders. Pt denies discharge or vag bleeding. States she has tried to get into the health department but they have not had appointments. Denies hematuria.

## 2018-09-21 NOTE — Discharge Instructions (Addendum)
Follow-up with the women's clinic in 1 to 2 weeks

## 2018-09-21 NOTE — ED Notes (Signed)
Patient verbalizes understanding of discharge instructions. Opportunity for questioning and answers were provided. Pt discharged from ED. 

## 2018-09-22 LAB — GC/CHLAMYDIA PROBE AMP (~~LOC~~) NOT AT ARMC
Chlamydia: NEGATIVE
Neisseria Gonorrhea: NEGATIVE

## 2019-03-20 ENCOUNTER — Other Ambulatory Visit: Payer: Self-pay

## 2019-03-20 ENCOUNTER — Encounter (HOSPITAL_COMMUNITY): Payer: Self-pay | Admitting: Emergency Medicine

## 2019-03-20 ENCOUNTER — Emergency Department (HOSPITAL_COMMUNITY)
Admission: EM | Admit: 2019-03-20 | Discharge: 2019-03-21 | Disposition: A | Discharge status: Home / Self Care | Payer: No Typology Code available for payment source | Attending: Emergency Medicine | Admitting: Emergency Medicine

## 2019-03-20 DIAGNOSIS — Z79899 Other long term (current) drug therapy: Secondary | ICD-10-CM | POA: Diagnosis not present

## 2019-03-20 DIAGNOSIS — J45909 Unspecified asthma, uncomplicated: Secondary | ICD-10-CM | POA: Diagnosis not present

## 2019-03-20 DIAGNOSIS — R001 Bradycardia, unspecified: Secondary | ICD-10-CM | POA: Diagnosis not present

## 2019-03-20 DIAGNOSIS — F17210 Nicotine dependence, cigarettes, uncomplicated: Secondary | ICD-10-CM | POA: Diagnosis not present

## 2019-03-20 DIAGNOSIS — R1084 Generalized abdominal pain: Secondary | ICD-10-CM

## 2019-03-20 LAB — URINALYSIS, ROUTINE W REFLEX MICROSCOPIC
Bilirubin Urine: NEGATIVE
Glucose, UA: NEGATIVE mg/dL
Hgb urine dipstick: NEGATIVE
Ketones, ur: NEGATIVE mg/dL
Leukocytes,Ua: NEGATIVE
Nitrite: NEGATIVE
Protein, ur: NEGATIVE mg/dL
Specific Gravity, Urine: 1.018 (ref 1.005–1.030)
pH: 7 (ref 5.0–8.0)

## 2019-03-20 LAB — CBC
HCT: 43.2 % (ref 36.0–46.0)
Hemoglobin: 14.2 g/dL (ref 12.0–15.0)
MCH: 33.1 pg (ref 26.0–34.0)
MCHC: 32.9 g/dL (ref 30.0–36.0)
MCV: 100.7 fL — ABNORMAL HIGH (ref 80.0–100.0)
Platelets: 199 10*3/uL (ref 150–400)
RBC: 4.29 MIL/uL (ref 3.87–5.11)
RDW: 13 % (ref 11.5–15.5)
WBC: 5.6 10*3/uL (ref 4.0–10.5)
nRBC: 0 % (ref 0.0–0.2)

## 2019-03-20 LAB — I-STAT BETA HCG BLOOD, ED (MC, WL, AP ONLY): I-stat hCG, quantitative: <5 m[IU]/mL (ref <5)

## 2019-03-20 LAB — COMPREHENSIVE METABOLIC PANEL
ALT: 15 U/L (ref 0–44)
AST: 17 U/L (ref 15–41)
Albumin: 4 g/dL (ref 3.5–5.0)
Alkaline Phosphatase: 113 U/L (ref 38–126)
Anion gap: 8 (ref 5–15)
BUN: 11 mg/dL (ref 6–20)
CO2: 27 mmol/L (ref 22–32)
Calcium: 9.8 mg/dL (ref 8.9–10.3)
Chloride: 106 mmol/L (ref 98–111)
Creatinine, Ser: 0.89 mg/dL (ref 0.44–1.00)
GFR calc Af Amer: >60 mL/min (ref >60)
GFR calc non Af Amer: >60 mL/min (ref >60)
Glucose, Bld: 90 mg/dL (ref 70–99)
Potassium: 5 mmol/L (ref 3.5–5.1)
Sodium: 141 mmol/L (ref 135–145)
Total Bilirubin: 0.5 mg/dL (ref 0.3–1.2)
Total Protein: 6.8 g/dL (ref 6.5–8.1)

## 2019-03-20 LAB — LIPASE, BLOOD: Lipase: 35 U/L (ref 11–51)

## 2019-03-20 MED ORDER — SODIUM CHLORIDE 0.9% FLUSH: 3.0000 mL | Freq: Once | INTRAVENOUS | Status: DC

## 2019-03-20 MED ORDER — KETOROLAC TROMETHAMINE 30 MG/ML IJ SOLN
30.0000 mg | Freq: Once | INTRAMUSCULAR | Status: AC
  Administered 2019-03-20: 30 mg via INTRAVENOUS
  Filled 2019-03-20: qty 1

## 2019-03-20 MED ORDER — SODIUM CHLORIDE 0.9 % IV BOLUS
1000.0000 mL | Freq: Once | INTRAVENOUS | Status: AC
  Administered 2019-03-20: 1000 mL via INTRAVENOUS

## 2019-03-20 NOTE — ED Provider Notes (Signed)
[place] EMERGENCY DEPARTMENT Provider Note   CSN: [id number] Arrival date & time: 03/20/19  1927     History   Chief Complaint Chief Complaint  Patient presents with   Abdominal Pain    HPI Nancy Chang is a 53 y.o. female with a past medical history of asthma who presents for evaluation of generalized abdominal pain and distention.  She states is been ongoing issue for the last several months.  She states she had initially been seen by somebody in February for evaluation of symptoms.  Her work-up at that time was unremarkable and there is no cause for her symptoms.  She states that since February, she is continued to experience abdominal pain intermittently.  She also feels like she is having some abdominal bloating and distention.  She reports some increased urinary frequency but has not any dysuria, hematuria.  She states she has not had any nausea/vomiting/diarrhea and has been able to eat and drink with any difficulty.  She has not noted any fever.  She states that she has noted that she has had some weight loss since February.  She estimates about 20 pounds but is unsure.  She has not had any night sweats.  She currently denies any chest pain.  She reports that she occasionally has some chest tightness and shortness of breath that is intermittently occurring.  She states is been ongoing issue for several months and is not new.  She states it is unchanged in severity.  The chest tightness comes at random times and she denies that it is a pain.  She states it is not worse with deep inspiration or exertion.  She has no associated nausea/vomiting diaphoresis.  Patient denies any dysuria, hematuria, vaginal discharge, vaginal bleeding, constipation, night sweats.  She does smoke cigarettes.  She denies any cocaine or IV drug use.  She denies any personal cardiac history or family history of MIs before the age of 31. She denies any OCP use, recent immobilization, prior history  of DVT/PE, recent surgery, leg swelling, or long travel.  She denies any recent travel or COVID-19 exposure.     The history is provided by the patient.    Past Medical History:  Diagnosis Date   Asthma     There are no active problems to display for this patient.   Past Surgical History:  Procedure Laterality Date   CESAREAN SECTION     FOOT SURGERY       OB History   No obstetric history on file.      Home Medications    Prior to Admission medications   Medication Sig Start Date End Date Taking? Authorizing Provider  cholecalciferol (VITAMIN D) 1000 units tablet Take 1,000 Units by mouth daily.    [provider]  cyclobenzaprine (FLEXERIL) 10 MG tablet Take 1 tablet (10 mg total) by mouth 2 (two) times daily as needed for muscle spasms. 07/01/18   Domenic Moras, PA-C  doxycycline (VIBRAMYCIN) 100 MG capsule Take 1 capsule (100 mg total) by mouth 2 (two) times daily. One po bid x 7 days 09/21/18   Milton Ferguson, MD  gabapentin (NEURONTIN) 100 MG capsule Take 1 capsule (100 mg total) by mouth 2 (two) times daily as needed (pain). 10/21/16   Clayton Bibles, PA-C  guaiFENesin (MUCINEX) 600 MG 12 hr tablet Take 600 mg by mouth 2 (two) times daily.    [provider]  ibuprofen (ADVIL,MOTRIN) 800 MG tablet Take 1 tablet (800 mg  total) by mouth every 8 (eight) hours as needed for moderate pain. 07/01/18   Fayrene Helperran, Bowie, PA-C  Multiple Vitamin (MULTIVITAMIN WITH MINERALS) TABS tablet Take 1 tablet by mouth daily.    [provider]  OVER THE COUNTER MEDICATION Take 30 mLs by mouth once.    [provider]  predniSONE (DELTASONE) 20 MG tablet 2 tabs po daily x 4 days 12/05/17   Arby BarrettePfeiffer, Marcy, MD  vitamin B-12 (CYANOCOBALAMIN) 1000 MCG tablet Take 1,000 mcg by mouth daily.    [provider]  vitamin C (ASCORBIC ACID) 250 MG tablet Take 250 mg by mouth daily.    [provider]    Family History No family history on file.  Social  History Social History   Tobacco Use   Smoking status: Current Every Day Smoker    Packs/day: 1.00    Types: Cigarettes   Smokeless tobacco: Never Used  Substance Use Topics   Alcohol use: No   Drug use: No     Allergies   Patient has no known allergies.   Review of Systems Review of Systems  Constitutional: Positive for unexpected weight change. Negative for fever.  Respiratory: Positive for chest tightness (Chronic). Negative for cough and shortness of breath.   Cardiovascular: Negative for chest pain.  Gastrointestinal: Positive for abdominal distention and abdominal pain. Negative for nausea and vomiting.  Genitourinary: Negative for dysuria, hematuria and vaginal discharge.  Neurological: Negative for weakness, numbness and headaches.  All other systems reviewed and are negative.    Physical Exam Updated Vital Signs BP (!) 144/86    Pulse (!) 48    Temp 98 F (36.7 C) (Oral)    Resp 14    LMP 07/04/2014    SpO2 100%   Physical Exam Vitals signs and nursing note reviewed.  Constitutional:      Appearance: Normal appearance. She is well-developed.     Comments: Sitting comfortably on examination table  HENT:     Head: Normocephalic and atraumatic.  Eyes:     General: Lids are normal.     Conjunctiva/sclera: Conjunctivae normal.     Pupils: Pupils are equal, round, and reactive to light.  Neck:     Musculoskeletal: Full passive range of motion without pain.  Cardiovascular:     Rate and Rhythm: Regular rhythm. Bradycardia present.     Pulses: Normal pulses.          Radial pulses are 2+ on the right side and 2+ on the left side.       Dorsalis pedis pulses are 2+ on the right side and 2+ on the left side.     Heart sounds: Normal heart sounds. No murmur. No friction rub. No gallop.   Pulmonary:     Effort: Pulmonary effort is normal.     Breath sounds: Normal breath sounds.     Comments: Lungs clear to auscultation bilaterally.  Symmetric chest rise.  No  wheezing, rales, rhonchi. Abdominal:     Palpations: Abdomen is soft. Abdomen is not rigid.     Tenderness: There is generalized abdominal tenderness. There is no guarding.     Comments: Abdomen is soft, nondistended.  Diffuse tenderness with no focal point.  No rigidity, guarding.  No CVA tenderness bilaterally.  Musculoskeletal: Normal range of motion.  Skin:    General: Skin is warm and dry.     Capillary Refill: Capillary refill takes less than 2 seconds.  Neurological:     Mental Status:  She is alert and oriented to person, place, and time.  Psychiatric:        Speech: Speech normal.      ED Treatments / Results  Labs (all labs ordered are listed, but only abnormal results are displayed) Labs Reviewed  CBC - Abnormal; Notable for the following components:      Result Value   MCV 100.7 (*)    All other components within normal limits  LIPASE, BLOOD  COMPREHENSIVE METABOLIC PANEL  URINALYSIS, ROUTINE W REFLEX MICROSCOPIC  I-STAT BETA HCG BLOOD, ED ([facility], [facility], [facility] ONLY)    EKG None  Radiology No results found.  Procedures Procedures (including critical care time)  Medications Ordered in ED Medications  sodium chloride flush (NS) 0.9 % injection 3 mL (has no administration in time range)     Initial Impression / Assessment and Plan / ED Course  I have reviewed the triage vital signs and the nursing notes.  Pertinent labs & imaging results that were available during my care of the patient were reviewed by me and considered in my medical decision making (see chart for details).        53 year old female who presents for evaluation of abdominal pain, abdominal bloating/distention.  Reports is been ongoing issue since February but feels like it is getting worse.  Also feels like she is having increased urinary frequency though denies any dysuria or hematuria.  No fevers but has noted some weight loss.  No night sweats.  She reports intermittent chest tightness and  shortness of breath but states that this is been an ongoing issue and is not new for her.  She reports some chest tightness that occurred last night but denies any difficulty breathing at this time.  Does not sound suspicious for ACS etiology. Patient is afebrile, non-toxic appearing, sitting comfortably on examination table.  She is slightly bradycardic with heart rate ranging between 55 and 60.  She did have a drop down to the 40s but when I talked to her, her heart rate maintains at an average 50-55.  Abdominal exam is benign.  I do not feel any obvious distention.  She does have some diffuse tenderness.  Will plan for labs.  Additionally, given vague history of chronic abdominal pain as well as weight loss, will plan for CT and pelvis for evaluation of any intra-abdominal abnormality.  UA negative for any infectious etiology.  I-STAT beta is negative. CBC without any significant leukocytosis or anemia.  Lipase is unremarkable.  CMP is unremarkable.  CT abdomen pelvis is negative for any acute intracranial abnormality or pelvic abnormality.  No evidence of infectious process.  Discussed results with patient.  She reports feeling better after medications.  Repeat abdominal exam is benign.  No tenderness.  I discussed with patient regarding follow-up with her primary care doctor.  Patient states that she is ready to go home and does not wish to stay in the hospital any longer.  Her repeat vitals show she still slightly bradycardic.  Otherwise vitals are stable.  When I talked to her, her heart rate maintains around 62 but does occasionally drop into 48.  EKG as documented by ED attending.  Encourage primary care follow-up. Discussed patient with Dr. Eudelia Bunchardama who is agreeable to plan. At this time, patient exhibits no emergent life-threatening condition that require further evaluation in ED or admission. Patient had ample opportunity for questions and discussion. All patient's questions were answered with full  understanding. .lladc  Portions of this note  were generated with Scientist, clinical (histocompatibility and immunogenetics)Dragon dictation software. Dictation errors may occur despite best attempts at proofreading.   Final Clinical Impressions(s) / ED Diagnoses   Final diagnoses:  None    ED Discharge Orders    None       Maxwell CaulLayden, Llewellyn Schoenberger A, PA-C 03/21/19 [id number]    Nira Connardama, Pedro Eduardo, MD 03/21/19 [id number]

## 2019-03-20 NOTE — ED Triage Notes (Signed)
Pt c/o abd distention, urinary frequency and pain, pt states this has been getting worse for last few months now having vaginal irration.  Denies n/v/d.

## 2019-03-20 NOTE — ED Notes (Addendum)
Patient's heartrate is in the 40s in sinus bradycardia.She is alert and oriented. EDP made aware.

## 2019-03-21 ENCOUNTER — Emergency Department (HOSPITAL_COMMUNITY): Payer: No Typology Code available for payment source

## 2019-03-21 MED ORDER — IOHEXOL 300 MG/ML  SOLN
100.0000 mL | Freq: Once | INTRAMUSCULAR | Status: AC | PRN
  Administered 2019-03-21: 100 mL via INTRAVENOUS

## 2019-03-21 NOTE — Discharge Instructions (Addendum)
Please follow-up with your primary care doctor regarding low heart rate.  Return the emergency department if you feel any dizziness, trouble breathing, chest pain or any other worsening or concerning symptoms.  You can take Tylenol or Ibuprofen as directed for pain. You can alternate Tylenol and Ibuprofen every 4 hours. If you take Tylenol at 1pm, then you can take Ibuprofen at 5pm. Then you can take Tylenol again at 9pm.   Follow-up with [facility] to establish a primary care doctor if you do not have one.   Return the emergency department for any worsening pain, fevers, vomiting or any other worsening or concerning symptoms.

## 2019-10-30 ENCOUNTER — Ambulatory Visit (INDEPENDENT_AMBULATORY_CARE_PROVIDER_SITE_OTHER): Payer: No Typology Code available for payment source

## 2019-10-30 ENCOUNTER — Ambulatory Visit
Admission: EM | Admit: 2019-10-30 | Discharge: 2019-10-30 | Disposition: A | Discharge status: Home / Self Care | Payer: No Typology Code available for payment source | Attending: Emergency Medicine | Admitting: Emergency Medicine

## 2019-10-30 ENCOUNTER — Other Ambulatory Visit: Payer: Self-pay

## 2019-10-30 DIAGNOSIS — R0789 Other chest pain: Secondary | ICD-10-CM

## 2019-10-30 DIAGNOSIS — M7918 Myalgia, other site: Secondary | ICD-10-CM | POA: Diagnosis not present

## 2019-10-30 MED ORDER — CYCLOBENZAPRINE HCL 5 MG PO TABS: 5.0000 mg | ORAL_TABLET | Freq: Two times a day (BID) | ORAL | 0 refills | Status: AC | PRN

## 2019-10-30 MED ORDER — NAPROXEN 500 MG PO TABS: 500.0000 mg | ORAL_TABLET | Freq: Two times a day (BID) | ORAL | 0 refills | Status: AC

## 2019-10-30 NOTE — Discharge Instructions (Addendum)
Recommend RICE: rest, ice, compression, elevation as needed for pain.    Heat therapy (hot compress, warm wash red, hot showers, etc.) can help relax muscles and soothe muscle aches. Cold therapy (ice packs) can be used to help swelling both after injury and after prolonged use of areas of chronic pain/aches.  For pain: recommend 350 mg-1000 mg of Tylenol (acetaminophen) and/or 200 mg - 800 mg of Advil (ibuprofen, Motrin) every 8 hours as needed.  May alternate between the two throughout the day as they are generally safe to take together.  DO NOT exceed more than 3000 mg of Tylenol or 3200 mg of ibuprofen in a 24 hour period as this could damage your stomach, kidneys, liver, or increase your bleeding risk.  May take muscle relaxer as needed for severe pain / spasm.  (This medication may cause you to become tired so it is important you do not drink alcohol or operate heavy machinery while on this medication.  Recommend your first dose to be taken before bedtime to monitor for side effects safely)  Return for
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
## 2019-11-03 ENCOUNTER — Emergency Department (HOSPITAL_COMMUNITY): Payer: No Typology Code available for payment source

## 2019-11-03 ENCOUNTER — Emergency Department (HOSPITAL_COMMUNITY)
Admission: EM | Admit: 2019-11-03 | Discharge: 2019-11-03 | Disposition: A | Discharge status: Home / Self Care | Payer: No Typology Code available for payment source | Attending: Emergency Medicine | Admitting: Emergency Medicine

## 2019-11-03 DIAGNOSIS — Z79899 Other long term (current) drug therapy: Secondary | ICD-10-CM | POA: Insufficient documentation

## 2019-11-03 DIAGNOSIS — I671 Cerebral aneurysm, nonruptured: Secondary | ICD-10-CM

## 2019-11-03 DIAGNOSIS — M7918 Myalgia, other site: Secondary | ICD-10-CM

## 2019-11-03 DIAGNOSIS — Z20822 Contact with and (suspected) exposure to covid-19: Secondary | ICD-10-CM | POA: Diagnosis not present

## 2019-11-03 DIAGNOSIS — J45909 Unspecified asthma, uncomplicated: Secondary | ICD-10-CM | POA: Diagnosis not present

## 2019-11-03 DIAGNOSIS — R0602 Shortness of breath: Secondary | ICD-10-CM | POA: Diagnosis present

## 2019-11-03 DIAGNOSIS — M791 Myalgia, unspecified site: Secondary | ICD-10-CM | POA: Insufficient documentation

## 2019-11-03 DIAGNOSIS — J3800 Paralysis of vocal cords and larynx, unspecified: Secondary | ICD-10-CM | POA: Diagnosis not present

## 2019-11-03 DIAGNOSIS — G44309 Post-traumatic headache, unspecified, not intractable: Secondary | ICD-10-CM | POA: Diagnosis not present

## 2019-11-03 DIAGNOSIS — R0600 Dyspnea, unspecified: Secondary | ICD-10-CM | POA: Diagnosis not present

## 2019-11-03 DIAGNOSIS — F17210 Nicotine dependence, cigarettes, uncomplicated: Secondary | ICD-10-CM | POA: Diagnosis not present

## 2019-11-03 DIAGNOSIS — I720 Aneurysm of carotid artery: Secondary | ICD-10-CM | POA: Diagnosis not present

## 2019-11-03 LAB — BASIC METABOLIC PANEL
Anion gap: 10 (ref 5–15)
BUN: 8 mg/dL (ref 6–20)
CO2: 24 mmol/L (ref 22–32)
Calcium: 9.3 mg/dL (ref 8.9–10.3)
Chloride: 108 mmol/L (ref 98–111)
Creatinine, Ser: 0.8 mg/dL (ref 0.44–1.00)
GFR calc Af Amer: >60 mL/min (ref >60)
GFR calc non Af Amer: >60 mL/min (ref >60)
Glucose, Bld: 73 mg/dL (ref 70–99)
Potassium: 4.4 mmol/L (ref 3.5–5.1)
Sodium: 142 mmol/L (ref 135–145)

## 2019-11-03 LAB — CBC WITH DIFFERENTIAL/PLATELET
Abs Immature Granulocytes: 0.01 10*3/uL (ref 0.00–0.07)
Basophils Absolute: 0 10*3/uL (ref 0.0–0.1)
Basophils Relative: 1 %
Eosinophils Absolute: 0.1 10*3/uL (ref 0.0–0.5)
Eosinophils Relative: 3 %
HCT: 48.7 % — ABNORMAL HIGH (ref 36.0–46.0)
Hemoglobin: 15.5 g/dL — ABNORMAL HIGH (ref 12.0–15.0)
Immature Granulocytes: 0 %
Lymphocytes Relative: 44 %
Lymphs Abs: 1.7 10*3/uL (ref 0.7–4.0)
MCH: 33.3 pg (ref 26.0–34.0)
MCHC: 31.8 g/dL (ref 30.0–36.0)
MCV: 104.5 fL — ABNORMAL HIGH (ref 80.0–100.0)
Monocytes Absolute: 0.4 10*3/uL (ref 0.1–1.0)
Monocytes Relative: 11 %
Neutro Abs: 1.6 10*3/uL — ABNORMAL LOW (ref 1.7–7.7)
Neutrophils Relative %: 41 %
Platelets: 199 10*3/uL (ref 150–400)
RBC: 4.66 MIL/uL (ref 3.87–5.11)
RDW: 13 % (ref 11.5–15.5)
WBC: 3.9 10*3/uL — ABNORMAL LOW (ref 4.0–10.5)
nRBC: 0 % (ref 0.0–0.2)

## 2019-11-03 LAB — TROPONIN I (HIGH SENSITIVITY): Troponin I (High Sensitivity): 3 ng/L (ref <18)

## 2019-11-03 LAB — POC SARS CORONAVIRUS 2 AG -  ED: SARS Coronavirus 2 Ag: NEGATIVE

## 2019-11-03 MED ORDER — ALBUTEROL SULFATE HFA 108 (90 BASE) MCG/ACT IN AERS
4.0000 | INHALATION_SPRAY | Freq: Once | RESPIRATORY_TRACT | Status: AC
  Administered 2019-11-03: 4 via RESPIRATORY_TRACT
  Filled 2019-11-03: qty 6.7

## 2019-11-03 MED ORDER — KETOROLAC TROMETHAMINE 30 MG/ML IJ SOLN: 30.0000 mg | Freq: Once | INTRAMUSCULAR | Status: DC

## 2019-11-03 MED ORDER — IOHEXOL 350 MG/ML SOLN
100.0000 mL | Freq: Once | INTRAVENOUS | Status: AC | PRN
  Administered 2019-11-03: 13:00:00 100 mL via INTRAVENOUS

## 2019-11-03 MED ORDER — KETOROLAC TROMETHAMINE 30 MG/ML IJ SOLN
30.0000 mg | Freq: Once | INTRAMUSCULAR | Status: AC
  Administered 2019-11-03: 10:00:00 30 mg via INTRAMUSCULAR
  Filled 2019-11-03: qty 1

## 2019-11-03 MED ORDER — ACETAMINOPHEN 500 MG PO TABS
1000.0000 mg | ORAL_TABLET | Freq: Once | ORAL | Status: AC
  Administered 2019-11-03: 08:00:00 1000 mg via ORAL
  Filled 2019-11-03: qty 2

## 2019-11-03 MED ORDER — METHOCARBAMOL 500 MG PO TABS
750.0000 mg | ORAL_TABLET | Freq: Once | ORAL | Status: AC
  Administered 2019-11-03: 750 mg via ORAL
  Filled 2019-11-03: qty 2

## 2019-11-03 NOTE — Discharge Instructions (Addendum)
You will also need to follow up with the interventional neurologist, Dr. 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 Please call either [phone] or [phone] to schedule an appointment.   You will need to call the ear nose and throat doctor, Dr. 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   You were also given a referral to an outpatient neurologist in regards to your concerns of numbness. The neurology office should reach out to you to schedule an appointment for follow up.    You should also take 2 puffs of your albuterol inhaler as needed every 4-6 hours for your wheezing and shortness of breath.  You can continue to rotate Tylenol and Motrin for your pain.  You were tested for the coronavirus today.  The results will be available in the next 1 to 2 days.  If the results are positive the hospital will contact you and let you know.  If the results are negative you will not be contacted by the hospital.  You should quarantine until you are aware of the results.  If the results are positive you should continue quarantining for at least 7 days since the onset of symptoms and 72 hours after your fever resolves and you have improvement of your symptoms.  Please return to the emergency department for any new or worsening symptoms.
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
## 2019-11-03 NOTE — ED Notes (Signed)
Patient transported to CT 
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
## 2019-11-03 NOTE — ED Notes (Signed)
RN and PA at bedside, MRI and plan of care discussed with patient. Pt agreeable to plan for discharge and follow up with ENT if MRI is normal. Pt denies any claustrophobia, states she doesn't think she has any metal in her body, but that she did have a toe fixed but is unsure of materials used.

## 2019-11-03 NOTE — ED Notes (Signed)
Difficulty with IV access IV team at bedside EDP notified

## 2019-11-04 ENCOUNTER — Other Ambulatory Visit (HOSPITAL_COMMUNITY): Payer: Self-pay | Admitting: Interventional Radiology

## 2019-11-04 DIAGNOSIS — I671 Cerebral aneurysm, nonruptured: Secondary | ICD-10-CM

## 2019-11-04 LAB — NOVEL CORONAVIRUS, NAA (HOSP ORDER, SEND-OUT TO REF LAB; TAT 18-24 HRS): SARS-CoV-2, NAA: NOT DETECTED

## 2019-11-08 ENCOUNTER — Ambulatory Visit (HOSPITAL_COMMUNITY)
Admission: RE | Admit: 2019-11-08 | Discharge: 2019-11-08 | Disposition: A | Discharge status: Home / Self Care | Payer: No Typology Code available for payment source | Source: Ambulatory Visit | Attending: Interventional Radiology | Admitting: Interventional Radiology

## 2019-11-08 ENCOUNTER — Other Ambulatory Visit: Payer: Self-pay

## 2019-11-08 DIAGNOSIS — I671 Cerebral aneurysm, nonruptured: Secondary | ICD-10-CM
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
## 2019-11-15 ENCOUNTER — Other Ambulatory Visit (HOSPITAL_COMMUNITY): Payer: Self-pay | Admitting: Interventional Radiology

## 2019-11-15 ENCOUNTER — Telehealth (HOSPITAL_COMMUNITY): Payer: Self-pay

## 2019-11-15 DIAGNOSIS — I671 Cerebral aneurysm, nonruptured: Secondary | ICD-10-CM

## 2019-11-15 NOTE — Telephone Encounter (Signed)
Called to schedule diagnostic angiogram, no answer, left vm. AW  

## 2019-11-16 ENCOUNTER — Other Ambulatory Visit: Payer: Self-pay | Admitting: Radiology

## 2019-11-18 ENCOUNTER — Other Ambulatory Visit (HOSPITAL_COMMUNITY): Payer: Self-pay | Admitting: Interventional Radiology

## 2019-11-18 ENCOUNTER — Ambulatory Visit (HOSPITAL_COMMUNITY)
Admission: RE | Admit: 2019-11-18 | Discharge: 2019-11-18 | Disposition: A | Discharge status: Home / Self Care | Payer: No Typology Code available for payment source | Source: Ambulatory Visit | Attending: Interventional Radiology | Admitting: Interventional Radiology

## 2019-11-18 ENCOUNTER — Other Ambulatory Visit: Payer: Self-pay

## 2019-11-18 DIAGNOSIS — F17210 Nicotine dependence, cigarettes, uncomplicated: Secondary | ICD-10-CM | POA: Insufficient documentation

## 2019-11-18 DIAGNOSIS — I671 Cerebral aneurysm, nonruptured: Secondary | ICD-10-CM

## 2019-11-18 DIAGNOSIS — Z79899 Other long term (current) drug therapy: Secondary | ICD-10-CM | POA: Insufficient documentation

## 2019-11-18 HISTORY — PX: IR ANGIO VERTEBRAL SEL VERTEBRAL BILAT MOD SED: IMG5369

## 2019-11-18 HISTORY — PX: IR ANGIO INTRA EXTRACRAN SEL COM CAROTID INNOMINATE BILAT MOD SED: IMG5360

## 2019-11-18 LAB — CBC
HCT: 43.3 % (ref 36.0–46.0)
Hemoglobin: 14.3 g/dL (ref 12.0–15.0)
MCH: 33.1 pg (ref 26.0–34.0)
MCHC: 33 g/dL (ref 30.0–36.0)
MCV: 100.2 fL — ABNORMAL HIGH (ref 80.0–100.0)
Platelets: 239 10*3/uL (ref 150–400)
RBC: 4.32 MIL/uL (ref 3.87–5.11)
RDW: 12.6 % (ref 11.5–15.5)
WBC: 5 10*3/uL (ref 4.0–10.5)
nRBC: 0 % (ref 0.0–0.2)

## 2019-11-18 LAB — BASIC METABOLIC PANEL
Anion gap: 9 (ref 5–15)
BUN: 8 mg/dL (ref 6–20)
CO2: 27 mmol/L (ref 22–32)
Calcium: 9.8 mg/dL (ref 8.9–10.3)
Chloride: 104 mmol/L (ref 98–111)
Creatinine, Ser: 0.9 mg/dL (ref 0.44–1.00)
GFR calc Af Amer: >60 mL/min (ref >60)
GFR calc non Af Amer: >60 mL/min (ref >60)
Glucose, Bld: 87 mg/dL (ref 70–99)
Potassium: 4.3 mmol/L (ref 3.5–5.1)
Sodium: 140 mmol/L (ref 135–145)

## 2019-11-18 LAB — PROTIME-INR
INR: 0.9 (ref 0.8–1.2)
Prothrombin Time: 11.9 seconds (ref 11.4–15.2)

## 2019-11-18 MED ORDER — MIDAZOLAM HCL 2 MG/2ML IJ SOLN
INTRAMUSCULAR | Status: AC
  Filled 2019-11-18: qty 2

## 2019-11-18 MED ORDER — LIDOCAINE HCL 1 % IJ SOLN
INTRAMUSCULAR | Status: AC
  Filled 2019-11-18: qty 20

## 2019-11-18 MED ORDER — MIDAZOLAM HCL 2 MG/2ML IJ SOLN
INTRAMUSCULAR | Status: AC | PRN
  Administered 2019-11-18: 1 mg via INTRAVENOUS

## 2019-11-18 MED ORDER — HEPARIN SODIUM (PORCINE) 1000 UNIT/ML IJ SOLN
INTRAMUSCULAR | Status: AC | PRN
  Administered 2019-11-18: 1000 [IU] via INTRAVENOUS

## 2019-11-18 MED ORDER — SODIUM CHLORIDE 0.9 % IV SOLN: INTRAVENOUS | Status: AC

## 2019-11-18 MED ORDER — IOHEXOL 300 MG/ML  SOLN
50.0000 mL | Freq: Once | INTRAMUSCULAR | Status: AC | PRN
  Administered 2019-11-18: 5 mL via INTRA_ARTERIAL

## 2019-11-18 MED ORDER — ASPIRIN 325 MG PO TABS
325.0000 mg | ORAL_TABLET | Freq: Every day | ORAL | Status: DC
  Administered 2019-11-18: 325 mg via ORAL

## 2019-11-18 MED ORDER — SODIUM CHLORIDE 0.9 % IV SOLN: Freq: Once | INTRAVENOUS | Status: DC

## 2019-11-18 MED ORDER — FENTANYL CITRATE (PF) 100 MCG/2ML IJ SOLN
INTRAMUSCULAR | Status: AC | PRN
  Administered 2019-11-18: 25 ug via INTRAVENOUS

## 2019-11-18 MED ORDER — ASPIRIN 325 MG PO TABS
ORAL_TABLET | ORAL | Status: AC
  Filled 2019-11-18: qty 1

## 2019-11-18 MED ORDER — HEPARIN SODIUM (PORCINE) 1000 UNIT/ML IJ SOLN
INTRAMUSCULAR | Status: AC
  Filled 2019-11-18: qty 1

## 2019-11-18 MED ORDER — FENTANYL CITRATE (PF) 100 MCG/2ML IJ SOLN
INTRAMUSCULAR | Status: AC
  Filled 2019-11-18: qty 2

## 2019-11-18 MED ORDER — IOHEXOL 300 MG/ML  SOLN
150.0000 mL | Freq: Once | INTRAMUSCULAR | Status: AC | PRN
  Administered 2019-11-18: 75 mL via INTRA_ARTERIAL

## 2019-11-18 NOTE — Procedures (Signed)
S/P 4 vessel cerebral arteriogram  Lt CFA approach. Findings. 1.Approx 2.66mm x 52mm Lt PCOM outpouching ,probable infundibulum. S.Azuri Bozard MD
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
## 2019-11-18 NOTE — Progress Notes (Signed)
Up and walked and tolerated well; left groin stable, no bleeding or hematoma 

## 2019-11-18 NOTE — Discharge Instructions (Signed)
Cerebral Angiogram, Care After This sheet gives you information about how to care for yourself after your procedure. Your health care provider may also give you more specific instructions. If you have problems or questions, contact your health care provider. What can I expect after the procedure? After the procedure, it is common to have:  Bruising and tenderness at the catheter insertion site.  A mild headache. Follow these instructions at home: Insertion site care  Follow instructions from your health care provider about how to take care of the insertion site. Make sure you: ? Wash your hands with soap and water before and after you change your bandage (dressing). If soap and water are not available, use hand sanitizer. ? Change your dressing as told by your health care provider.  Do not take baths, swim, or use a hot tub until your health care provider approves. You may shower 24-48 hours after the procedure, or as told by your health care provider.  To clean your insertion site: ? Gently wash the site with plain soap and water. ? Pat the area dry with a clean towel. ? Do not rub the site. This may cause bleeding.  Do not apply powder or lotion to the site. Keep the site clean and dry. Infection signs Check your incision area every day for signs of infection. Check for:  Redness, swelling, or pain.  Fluid or blood.  Warmth.  Pus or a bad smell.  Activity  Do not drive for 24 hours if you were given a sedative during your procedure.  Rest as told by your health care provider.  Do not lift anything that is heavier than 10 lb (4.5 kg), or the limit that you are told, until your health care provider says that it is safe.  Return to your normal activities as told by your health care provider, usually in about a week. Ask your health care provider what activities are safe for you. General instructions   If your insertion site starts to bleed, lie flat and put pressure on  the site. If the bleeding does not stop, get help right away. This is a medical emergency.  Do not use any products that contain nicotine or tobacco, such as cigarettes, e-cigarettes, and chewing tobacco. If you need help quitting, ask your health care provider.  Take over-the-counter and prescription medicines only as told by your health care provider.  Drink enough fluid to keep your urine pale yellow. This helps flush the contrast dye from your body.  Keep all follow-up visits as directed by your health care provider. This is important. Contact a health care provider if:  You have a fever or chills.  You have redness, swelling, or pain around your insertion site.  You have fluid or blood coming from your insertion site.  The insertion site feels warm to the touch.  You have pus or a bad smell coming from your insertion site.  You notice blood collecting in the tissue around the insertion site (hematoma). The hematoma may be painful to the touch. Get help right away if:  You have chest pain or trouble breathing.  You have severe pain or swelling at the insertion site.  The insertion area bleeds, and bleeding continues after 30 minutes of holding steady pressure on the site.  The arm or leg where the catheter was inserted is pale, cold, numb, tingling, or weak.  You have a rash.  You have any symptoms of a stroke. "BE FAST" is an easy  way to remember the main warning signs of a stroke: ? B - Balance. Signs are dizziness, sudden trouble walking, or loss of balance. ? E - Eyes. Signs are trouble seeing or a sudden change in vision. ? F - Face. Signs are sudden weakness or numbness of the face, or the face or eyelid drooping on one side. ? A - Arms. Signs are weakness or numbness in an arm. This happens suddenly and usually on one side of the body. ? S - Speech. Signs are sudden trouble speaking, slurred speech, or trouble understanding what people say. ? T - Time. Time to call  emergency services. Write down what time symptoms started.  You have other signs of a stroke, such as: ? A sudden, severe headache with no known cause. ? Nausea or vomiting. ? Seizure. These symptoms may represent a serious problem that is an emergency. Do not wait to see if the symptoms will go away. Get medical help right away. Call your local emergency services (911 in the [place] Do not drive yourself to the hospital. Summary  Bruising and tenderness at the insertion site are common.  Follow your health care provider's instructions about caring for your insertion site. Change dressing and clean the area as instructed.  If your insertion site bleeds, apply direct pressure until bleeding stops.  Return to your normal activities as told by your health care provider. Ask what activities are safe.  Rest and drink plenty of fluids. This information is not intended to replace advice given to you by your health care provider. Make sure you discuss any questions you have with your health care provider. Document Revised: 03/22/2019 Document Reviewed: 03/22/2019 Elsevier Patient Education  2020 [employer]    Femoral Site Care This sheet gives you information about how to care for yourself after your procedure. Your health care provider may also give you more specific instructions. If you have problems or questions, contact your health care provider. What can I expect after the procedure? After the procedure, it is common to have:  Bruising that usually fades within 1-2 weeks.  Tenderness at the site. Follow these instructions at home: Wound care  Follow instructions from your health care provider about how to take care of your insertion site. Make sure you: ? Wash your hands with soap and water before you change your bandage (dressing). If soap and water are not available, use hand sanitizer. ? Change your dressing as told by your health care provider. ? Leave stitches (sutures),  skin glue, or adhesive strips in place. These skin closures may need to stay in place for 2 weeks or longer. If adhesive strip edges start to loosen and curl up, you may trim the loose edges. Do not remove adhesive strips completely unless your health care provider tells you to do that.  Do not take baths, swim, or use a hot tub until your health care provider approves.  You may shower 24-48 hours after the procedure or as told by your health care provider. ? Gently wash the site with plain soap and water. ? Pat the area dry with a clean towel. ? Do not rub the site. This may cause bleeding.  Do not apply powder or lotion to the site. Keep the site clean and dry.  Check your femoral site every day for signs of infection. Check for: ? Redness, swelling, or pain. ? Fluid or blood. ? Warmth. ? Pus or a bad smell. Activity  For the first 2-3  days after your procedure, or as long as directed: ? Avoid climbing stairs as much as possible. ? Do not squat.  Do not lift anything that is heavier than 10 lb (4.5 kg), or the limit that you are told, until your health care provider says that it is safe.  Rest as directed. ? Avoid sitting for a long time without moving. Get up to take short walks every 1-2 hours.  Do not drive for 24 hours if you were given a medicine to help you relax (sedative). General instructions  Take over-the-counter and prescription medicines only as told by your health care provider.  Keep all follow-up visits as told by your health care provider. This is important. Contact a health care provider if you have:  A fever or chills.  You have redness, swelling, or pain around your insertion site. Get help right away if:  The catheter insertion area swells very fast.  You pass out.  You suddenly start to sweat or your skin gets clammy.  The catheter insertion area is bleeding, and the bleeding does not stop when you hold steady pressure on the area.  The area  near or just beyond the catheter insertion site becomes pale, cool, tingly, or numb. These symptoms may represent a serious problem that is an emergency. Do not wait to see if the symptoms will go away. Get medical help right away. Call your local emergency services (911 in the [place] Do not drive yourself to the hospital. Summary  After the procedure, it is common to have bruising that usually fades within 1-2 weeks.  Check your femoral site every day for signs of infection.  Do not lift anything that is heavier than 10 lb (4.5 kg), or the limit that you are told, until your health care provider says that it is safe. This information is not intended to replace advice given to you by your health care provider. Make sure you discuss any questions you have with your health care provider. Document Revised: 09/14/2017 Document Reviewed: 09/14/2017 Elsevier Patient Education  2020 [employer].

## 2019-11-18 NOTE — Sedation Documentation (Signed)
23fr exoseal deployed

## 2019-11-25 ENCOUNTER — Encounter (HOSPITAL_COMMUNITY): Payer: Self-pay

## 2019-12-07 ENCOUNTER — Other Ambulatory Visit: Payer: Self-pay

## 2019-12-07 ENCOUNTER — Telehealth: Payer: Self-pay | Admitting: Neurology

## 2019-12-07 ENCOUNTER — Ambulatory Visit (INDEPENDENT_AMBULATORY_CARE_PROVIDER_SITE_OTHER): Payer: No Typology Code available for payment source | Admitting: Neurology

## 2019-12-07 ENCOUNTER — Encounter: Payer: Self-pay | Admitting: Neurology

## 2019-12-07 VITALS — BP 126/89 | HR 81 | Temp 97.6°F | Ht 66.0 in | Wt 178.0 lb

## 2019-12-07 DIAGNOSIS — M549 Dorsalgia, unspecified: Secondary | ICD-10-CM | POA: Diagnosis not present

## 2019-12-07 DIAGNOSIS — M545 Low back pain: Secondary | ICD-10-CM

## 2019-12-07 DIAGNOSIS — R9402 Abnormal brain scan: Secondary | ICD-10-CM | POA: Diagnosis not present

## 2019-12-07 DIAGNOSIS — G8929 Other chronic pain: Secondary | ICD-10-CM

## 2019-12-07 DIAGNOSIS — R933 Abnormal findings on diagnostic imaging of other parts of digestive tract: Secondary | ICD-10-CM

## 2019-12-07 MED ORDER — MELOXICAM 7.5 MG PO TABS: 7.5000 mg | ORAL_TABLET | Freq: Two times a day (BID) | ORAL | 2 refills | Status: AC | PRN
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
## 2019-12-07 NOTE — Telephone Encounter (Signed)
[facility] order sent to [facility]. They will reach out to the patient to schedule.  

## 2019-12-21 ENCOUNTER — Other Ambulatory Visit: Payer: No Typology Code available for payment source

## 2020-05-23 ENCOUNTER — Other Ambulatory Visit (HOSPITAL_COMMUNITY): Payer: Self-pay | Admitting: Interventional Radiology

## 2020-05-23 DIAGNOSIS — I671 Cerebral aneurysm, nonruptured: Secondary | ICD-10-CM

## 2020-06-05 ENCOUNTER — Ambulatory Visit (HOSPITAL_COMMUNITY)
Admission: RE | Admit: 2020-06-05 | Discharge: 2020-06-05 | Disposition: A | Discharge status: Home / Self Care | Payer: No Typology Code available for payment source | Source: Ambulatory Visit | Attending: Interventional Radiology | Admitting: Interventional Radiology

## 2020-06-05 ENCOUNTER — Other Ambulatory Visit: Payer: Self-pay

## 2020-06-05 DIAGNOSIS — I671 Cerebral aneurysm, nonruptured: Secondary | ICD-10-CM | POA: Diagnosis present

## 2020-06-06 ENCOUNTER — Telehealth (HOSPITAL_COMMUNITY): Payer: Self-pay

## 2020-06-06 NOTE — Telephone Encounter (Signed)
Pt agreed to f/u in 6 months with mra. AW  
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
## 2021-04-21 ENCOUNTER — Emergency Department (HOSPITAL_COMMUNITY)
Admission: EM | Admit: 2021-04-21 | Discharge: 2021-04-21 | Disposition: A | Discharge status: Home / Self Care | Payer: No Typology Code available for payment source | Attending: Emergency Medicine | Admitting: Emergency Medicine

## 2021-04-21 ENCOUNTER — Encounter (HOSPITAL_COMMUNITY): Payer: Self-pay | Admitting: Emergency Medicine

## 2021-04-21 ENCOUNTER — Emergency Department (HOSPITAL_COMMUNITY): Payer: No Typology Code available for payment source

## 2021-04-21 DIAGNOSIS — M79601 Pain in right arm: Secondary | ICD-10-CM | POA: Insufficient documentation

## 2021-04-21 DIAGNOSIS — Z87891 Personal history of nicotine dependence: Secondary | ICD-10-CM | POA: Diagnosis not present

## 2021-04-21 DIAGNOSIS — J449 Chronic obstructive pulmonary disease, unspecified: Secondary | ICD-10-CM | POA: Insufficient documentation

## 2021-04-21 NOTE — ED Triage Notes (Signed)
Pt reports twisting her arm on a machine at work and now c/o arm pain, wrist pain and shoulder pain.

## 2021-04-21 NOTE — Discharge Instructions (Addendum)
At this time there does not appear to be the presence of an emergent medical condition, however there is always the potential for conditions to change. Please read and follow the below instructions.  Please return to the Emergency Department immediately for any new or worsening symptoms. Please be sure to follow up with your Primary Care Provider within one week regarding your visit today; please call their office to schedule an appointment even if you are feeling better for a follow-up visit.  If you do not have a primary care provider you may call [facility] to establish one. Please call the hand specialist Dr. Everardo Pacific under discharge paperwork to schedule follow-up appointment this week for further evaluation and treatment of your right arm injury.  Please use the thumb spica splint to protect the small bones of your right thumb and wrist from further injury.  You may use the sling given to today to help support your right elbow.  Please use rest ice and elevation to help with pain.  Go to the nearest Emergency Department immediately if: You have fever or chills You lose feeling in your fingers or hand. Your fingers turn white, very red, or cold and blue. You cannot move your fingers. You have any new/concerning or worsening of symptoms   Please read the additional information packets attached to your discharge summary.  Do not take your medicine if  develop an itchy rash, swelling in your mouth or lips, or difficulty breathing; call 911 and seek immediate emergency medical attention if this occurs.  You may review your lab tests and imaging results in their entirety on your MyChart account.  Please discuss all results of fully with your primary care provider and other specialist at your follow-up visit.  Note: Portions of this text may have been transcribed using voice recognition software. Every effort was made to ensure accuracy; however, inadvertent  computerized transcription errors may still be present.

## 2021-04-21 NOTE — ED Provider Notes (Signed)
[facility]-EMERGENCY DEPT Provider Note   CSN: [id number] Arrival date & time: 04/21/21  0050     History Chief Complaint  Patient presents with   Arm Pain   Shoulder Pain    N[facility] is a 55 y.o. female who reports a history of COPD otherwise healthy presents today for right arm injury.  Patient reports that she was at work around 7 hours ago when she was trying to unlock a machine which held a roll of tubing.  Patient reports that the lock mechanism broke and her right arm which was extended was inwardly rotated causing pain at the base of her thumb and her medial elbow.  Pain was immediate moderate intensity worsens with movement has improved with rest and time.  Patient reports pain will occasionally radiate up to her shoulder.  Patient denies head injury, loss consciousness, blood thinner use, numbness or weakness, tingling, neck/back pain, fever/chills or any additional injuries or concerns.  HPI     Past Medical History:  Diagnosis Date   Asthma     Patient Active Problem List   Diagnosis Date Noted   Chronic bilateral low back pain without sciatica 12/07/2019   Upper back pain 12/07/2019   Abnormal brain scan 12/07/2019    Past Surgical History:  Procedure Laterality Date   CESAREAN SECTION     x5   FOOT SURGERY Left    IR ANGIO INTRA EXTRACRAN SEL COM CAROTID INNOMINATE BILAT MOD SED  11/18/2019   IR ANGIO VERTEBRAL SEL VERTEBRAL BILAT MOD SED  11/18/2019     OB History   No obstetric history on file.     No family history on file.  Social History   Tobacco Use   Smoking status: Former    Packs/day: 1.00    Types: Cigarettes   Smokeless tobacco: Never  Substance Use Topics   Alcohol use: No   Drug use: No    Home Medications Prior to Admission medications   Medication Sig Start Date End Date Taking? Authorizing Provider  albuterol (VENTOLIN HFA) 108 (90 Base) MCG/ACT inhaler Inhale into the lungs every 6 (six) hours as  needed for wheezing or shortness of breath.    [provider]  cholecalciferol (VITAMIN D) 1000 units tablet Take 1,000 Units by mouth daily.    [provider]  ibuprofen (ADVIL,MOTRIN) 800 MG tablet Take 1 tablet (800 mg total) by mouth every 8 (eight) hours as needed for moderate pain. 07/01/18   Fayrene Helper, PA-C  lurasidone (LATUDA) 40 MG TABS tablet Take 40 mg by mouth daily with breakfast.    [provider]  meloxicam (MOBIC) 7.5 MG tablet Take 1 tablet (7.5 mg total) by mouth 2 (two) times daily as needed for pain. 12/07/19   Levert Feinstein, MD  Multiple Vitamin (MULTIVITAMIN WITH MINERALS) TABS tablet Take 1 tablet by mouth daily.    [provider]  naproxen (NAPROSYN) 500 MG tablet Take 1 tablet (500 mg total) by mouth 2 (two) times daily. 10/30/19   Hall-Potvin, [place], PA-C  vitamin B-12 (CYANOCOBALAMIN) 1000 MCG tablet Take 1,000 mcg by mouth daily.    [provider]  vitamin C (ASCORBIC ACID) 250 MG tablet Take 250 mg by mouth daily.    [provider]    Allergies    Patient has no known allergies.  Review of Systems   Review of Systems  Constitutional: Negative.  Negative for chills and fever.  Musculoskeletal:  Positive for arthralgias.  Negative for neck pain.  Neurological: Negative.  Negative for weakness and numbness.   Physical Exam Updated Vital Signs BP (!) 142/96   Pulse 82   Temp 98.4 F (36.9 C) (Oral)   Resp 16   Ht 5\' 8"  (1.727 m)   Wt 81.6 kg   LMP 07/04/2014   SpO2 98%   BMI 27.37 kg/m   Physical Exam Constitutional:      General: She is not in acute distress.    Appearance: Normal appearance. She is well-developed. She is not ill-appearing or diaphoretic.  HENT:     Head: Normocephalic and atraumatic.  Eyes:     General: Vision grossly intact. Gaze aligned appropriately.     Pupils: Pupils are equal, round, and reactive to light.  Neck:     Trachea: Trachea and phonation normal.   Cardiovascular:     Rate and Rhythm: Normal rate and regular rhythm.     Pulses:          Radial pulses are 2+ on the right side and 2+ on the left side.  Pulmonary:     Effort: Pulmonary effort is normal. No respiratory distress.  Abdominal:     General: There is no distension.     Palpations: Abdomen is soft.     Tenderness: There is no abdominal tenderness. There is no guarding or rebound.  Musculoskeletal:        General: Normal range of motion.     Cervical back: Normal range of motion.     Comments: No midline C/T/L spinal tenderness to palpation, no paraspinal muscle tenderness, no deformity, crepitus, or step-off noted. No sign of injury to the neck or back. ----------- Right shoulder: No obvious injury.  No tenting or deformity.  No bony tenderness to palpation.  No muscular tenderness palpation.  Minimal pain with movement, no limitation with overhead movements.  Appropriate strength.  Right humerus: No TTP of the biceps or triceps.  No overlying skin changes  Right elbow: No deformity or overlying signs of injury.  Mild TTP of the medial epicondyle.  No TTP of the lateral epicondyle, antecubital fossa, olecranon or cubital tunnel.  Full range of motion and strength with flexion extension supination and pronation.  Mild pain with flexion.  Right forearm: No overlying signs of injury.  No significant TTP  Right wrist: Mild TTP along the scaphoid.  No TTP along the ulnar wrist, dorsal/volar wrist.  Full range of motion with flexion extension supination pronation and ulnar/radial deviation some pain with radial deviation and flexion only.  Right hand: No overlying signs of injury.  No deformity or tenting.  Full range of motion and strength with all movements of the hand and fingers some pain with thumb motion only.  Capillary refill and sensation intact to all fingers.  Strong and equal radial pulses.  Compartments soft.  Skin:    General: Skin is warm and dry.  Neurological:      Mental Status: She is alert.     GCS: GCS eye subscore is 4. GCS verbal subscore is 5. GCS motor subscore is 6.     Comments: Speech is clear and goal oriented, follows commands Major Cranial nerves without deficit, no facial droop Moves extremities without ataxia, coordination intact  Psychiatric:        Behavior: Behavior normal.    ED Results / Procedures / Treatments   Labs (all labs ordered are listed, but only abnormal results are displayed) Labs Reviewed - No data  to display  EKG None  Radiology DG Shoulder Right  Result Date: 04/21/2021 CLINICAL DATA:  Arm pain EXAM: RIGHT SHOULDER - 2+ VIEW COMPARISON:  None. FINDINGS: There is no evidence of fracture or dislocation. There is no evidence of arthropathy or other focal bone abnormality. Soft tissues are unremarkable. IMPRESSION: Negative. Electronically Signed   By: Deatra Robinson M.D.   On: 04/21/2021 02:02   DG Elbow Complete Right  Result Date: 04/21/2021 CLINICAL DATA:  Right arm pain EXAM: RIGHT ELBOW - COMPLETE 3+ VIEW COMPARISON:  None. FINDINGS: There is no evidence of fracture, dislocation, or joint effusion. There is no evidence of arthropathy or other focal bone abnormality. Soft tissues are unremarkable. IMPRESSION: Negative. Electronically Signed   By: Deatra Robinson M.D.   On: 04/21/2021 02:04   DG Forearm Right  Result Date: 04/21/2021 CLINICAL DATA:  Arm pain EXAM: RIGHT FOREARM - 2 VIEW COMPARISON:  None. FINDINGS: There is no evidence of fracture or other focal bone lesions. Soft tissues are unremarkable. IMPRESSION: Negative. Electronically Signed   By: Deatra Robinson M.D.   On: 04/21/2021 02:10   DG Wrist Complete Right  Result Date: 04/21/2021 CLINICAL DATA:  Right arm pain EXAM: RIGHT WRIST - COMPLETE 3+ VIEW COMPARISON:  None. FINDINGS: There is no evidence of fracture or dislocation. There is no evidence of arthropathy or other focal bone abnormality. Soft tissues are unremarkable. IMPRESSION: Negative.  Electronically Signed   By: Deatra Robinson M.D.   On: 04/21/2021 02:11    Procedures Procedures   Medications Ordered in ED Medications - No data to display  ED Course  I have reviewed the triage vital signs and the nursing notes.  Pertinent labs & imaging results that were available during my care of the patient were reviewed by me and considered in my medical decision making (see chart for details).    MDM Rules/Calculators/A&P                          Additional history obtained from: Nursing notes from this visit. -------- 55 year old female presented for right arm injury while at work around 7 hours ago.  Patient's right arm was twisted inward by a machine.  She has no overlying signs of injury.  She is mildly tender along the medial epicondyle of the right elbow and along the base of the right thumb.  No limitation to range of motion, some pain with movement of the thumb, wrist flexion and radial deviation along with elbow flexion.  She is neurovascularly intact.  No overlying skin changes tenting or deformity.  No evidence for open fracture, developing open fracture, DVT, cellulitis, septic arthritis, compartment syndrome, neurovascular compromise or other emergent pathologies at this time.  X-rays of the right wrist, right forearm, right elbow and right shoulder were obtained in triage and reviewed below.  DG Right Shoulder:  IMPRESSION:  Negative.   DG Right Elbow:  IMPRESSION:  Negative.   DG Right Forarm:    IMPRESSION:  Negative.   DG Right Wrist:  IMPRESSION:  Negative.   I have personally reviewed patient's x-rays and agree with radiologist interpretation above.  My suspicion at this time is patient suffered a strain/sprain at the right elbow and right thumb.  Patient has no pain at the right shoulder at time of evaluation low suspicion for injury of that area at this time.  The plan will be to protect the patient's right thumb with a thumb spica  splint, patient  made aware of potential injury at the scaphoid given her symptoms and that follow-up with the hand specialist on-call Dr. Everardo PacificVarkey is recommended.  I encouraged the patient to call their office today to schedule a follow-up appointment and to wear her thumb brace.  As for the patient's right elbow likely this is a sprain, there is no evidence for biceps rupture or other emergent injury at this time pain only occurs with palpation of the medial epicondyle and with flexion. Elbow is stable on exam with no significant laxity.  Plan will be for a sling for comfort and to again follow-up with Dr. Everardo PacificVarkey.  Patient will use rice therapy and OTC anti-inflammatories.  Patient was given a work note today.   At this time there does not appear to be any evidence of an acute emergency medical condition and the patient appears stable for discharge with appropriate outpatient follow up. Diagnosis was discussed with patient who verbalizes understanding of care plan and is agreeable to discharge. I have discussed return precautions with patient who verbalizes understanding. Patient encouraged to follow-up with their PCP and hand specialist. All questions answered.   Note: Portions of this report may have been transcribed using voice recognition software. Every effort was made to ensure accuracy; however, inadvertent computerized transcription errors may still be present.  Final Clinical Impression(s) / ED Diagnoses Final diagnoses:  Right arm pain    Rx / DC Orders ED Discharge Orders     None        Elizabeth PalauMorelli, Laurna Shetley A, PA-C 04/21/21 0729    Gerhard MunchLockwood, Robert, MD 04/22/21 [phone]

## 2021-04-21 NOTE — Progress Notes (Signed)
Orthopedic Tech Progress Note Patient Details:  [facility] Jul 10, 1966 [id number]  Signed adapt health brace form for pt with her consent as she is RHD/unable to sign.  Ortho Devices Type of Ortho Device: Thumb velcro splint, Sling immobilizer Ortho Device/Splint Location: RUE Ortho Device/Splint Interventions: Application, Adjustment   Post Interventions Patient Tolerated: Well Instructions Provided: Care of device, Adjustment of device  Hae Ahlers Carmine Savoy 04/21/2021, 10:21 AM

## 2022-05-24 ENCOUNTER — Encounter (HOSPITAL_COMMUNITY): Payer: Self-pay

## 2022-05-24 ENCOUNTER — Emergency Department (HOSPITAL_COMMUNITY)
Admission: EM | Admit: 2022-05-24 | Discharge: 2022-05-24 | Discharge status: Against Medical Advice | Payer: No Typology Code available for payment source | Attending: Emergency Medicine | Admitting: Emergency Medicine

## 2022-05-24 ENCOUNTER — Emergency Department (HOSPITAL_COMMUNITY): Payer: No Typology Code available for payment source

## 2022-05-24 ENCOUNTER — Other Ambulatory Visit: Payer: Self-pay

## 2022-05-24 DIAGNOSIS — J029 Acute pharyngitis, unspecified: Secondary | ICD-10-CM | POA: Diagnosis present

## 2022-05-24 DIAGNOSIS — U071 COVID-19: Secondary | ICD-10-CM

## 2022-05-24 LAB — CBC WITH DIFFERENTIAL/PLATELET
Abs Immature Granulocytes: 0.01 10*3/uL (ref 0.00–0.07)
Basophils Absolute: 0 10*3/uL (ref 0.0–0.1)
Basophils Relative: 1 %
Eosinophils Absolute: 0.1 10*3/uL (ref 0.0–0.5)
Eosinophils Relative: 1 %
HCT: 46.3 % — ABNORMAL HIGH (ref 36.0–46.0)
Hemoglobin: 15.7 g/dL — ABNORMAL HIGH (ref 12.0–15.0)
Immature Granulocytes: 0 %
Lymphocytes Relative: 43 %
Lymphs Abs: 2.1 10*3/uL (ref 0.7–4.0)
MCH: 33.7 pg (ref 26.0–34.0)
MCHC: 33.9 g/dL (ref 30.0–36.0)
MCV: 99.4 fL (ref 80.0–100.0)
Monocytes Absolute: 0.3 10*3/uL (ref 0.1–1.0)
Monocytes Relative: 7 %
Neutro Abs: 2.3 10*3/uL (ref 1.7–7.7)
Neutrophils Relative %: 48 %
Platelets: 212 10*3/uL (ref 150–400)
RBC: 4.66 MIL/uL (ref 3.87–5.11)
RDW: 13 % (ref 11.5–15.5)
WBC: 4.8 10*3/uL (ref 4.0–10.5)
nRBC: 0 % (ref 0.0–0.2)

## 2022-05-24 LAB — URINALYSIS, ROUTINE W REFLEX MICROSCOPIC
Bilirubin Urine: NEGATIVE
Glucose, UA: NEGATIVE mg/dL
Hgb urine dipstick: NEGATIVE
Ketones, ur: NEGATIVE mg/dL
Leukocytes,Ua: NEGATIVE
Nitrite: NEGATIVE
Protein, ur: NEGATIVE mg/dL
Specific Gravity, Urine: 1.012 (ref 1.005–1.030)
pH: 5 (ref 5.0–8.0)

## 2022-05-24 LAB — COMPREHENSIVE METABOLIC PANEL
ALT: 19 U/L (ref 0–44)
AST: 22 U/L (ref 15–41)
Albumin: 4.4 g/dL (ref 3.5–5.0)
Alkaline Phosphatase: 93 U/L (ref 38–126)
Anion gap: 12 (ref 5–15)
BUN: 8 mg/dL (ref 6–20)
CO2: 23 mmol/L (ref 22–32)
Calcium: 10 mg/dL (ref 8.9–10.3)
Chloride: 107 mmol/L (ref 98–111)
Creatinine, Ser: 0.76 mg/dL (ref 0.44–1.00)
GFR, Estimated: >60 mL/min (ref >60)
Glucose, Bld: 67 mg/dL — ABNORMAL LOW (ref 70–99)
Potassium: 3.7 mmol/L (ref 3.5–5.1)
Sodium: 142 mmol/L (ref 135–145)
Total Bilirubin: 0.3 mg/dL (ref 0.3–1.2)
Total Protein: 7.3 g/dL (ref 6.5–8.1)

## 2022-05-24 LAB — WET PREP, GENITAL
Clue Cells Wet Prep HPF POC: NONE SEEN
Sperm: NONE SEEN
Trich, Wet Prep: NONE SEEN
WBC, Wet Prep HPF POC: <10 (ref <10)
Yeast Wet Prep HPF POC: NONE SEEN

## 2022-05-24 LAB — SARS CORONAVIRUS 2 BY RT PCR: SARS Coronavirus 2 by RT PCR: POSITIVE — AB

## 2022-05-24 LAB — HIV ANTIBODY (ROUTINE TESTING W REFLEX): HIV Screen 4th Generation wRfx: NONREACTIVE

## 2022-05-24 LAB — GROUP A STREP BY PCR: Group A Strep by PCR: NOT DETECTED

## 2022-05-24 LAB — PREGNANCY, URINE: Preg Test, Ur: NEGATIVE

## 2022-05-24 LAB — TSH: TSH: 0.469 u[IU]/mL (ref 0.350–4.500)

## 2022-05-24 MED ORDER — SODIUM CHLORIDE 0.9 % IV BOLUS
1000.0000 mL | Freq: Once | INTRAVENOUS | Status: AC
  Administered 2022-05-24: 1000 mL via INTRAVENOUS

## 2022-05-24 MED ORDER — ONDANSETRON HCL 4 MG/2ML IJ SOLN
4.0000 mg | Freq: Once | INTRAMUSCULAR | Status: DC
  Filled 2022-05-24: qty 2

## 2022-05-24 NOTE — ED Notes (Signed)
Pt refused zofran due to not being nauseated anymore.

## 2022-05-24 NOTE — ED Notes (Signed)
Patient transported to X-ray 

## 2022-05-24 NOTE — ED Notes (Signed)
Patient reports that she has to take her son to work and cannot wait for results any longer. RN removed IV and verified patient pharmacy is accurate in the chart. Patient reports that she has and uses mychart and will watch for messages and results that come later.

## 2022-05-24 NOTE — ED Triage Notes (Signed)
Patient complains of possible exposure to STD. Complains of vaginal swelling and back pain as well as throat pain. Reports this partner having symptoms as well.

## 2022-05-24 NOTE — ED Provider Notes (Signed)
[facility] EMERGENCY DEPARTMENT Provider Note   CSN: [id number] Arrival date & time: 05/24/22  0940     History  No chief complaint on file.   Nancy Chang is a 56 y.o. female.  The history is provided by the patient and medical records. No language interpreter was used.  Illness Location:  Generalized fatigue, chills, sore throat, congestion, cough, vaginal discomfort Quality:  Moderate Severity:  Moderate Onset quality:  Gradual Duration:  1 week Timing:  Constant Progression:  Waxing and waning Chronicity:  New Context:  Susected STD exposute Associated symptoms: congestion, cough, fatigue, fever (subjective), myalgias, nausea and sore throat   Associated symptoms: no abdominal pain, no chest pain, no diarrhea, no ear pain, no headaches (no pain), no loss of consciousness, no rash, no shortness of breath, no vomiting and no wheezing        Home Medications Prior to Admission medications   Medication Sig Start Date End Date Taking? Authorizing Provider  albuterol (VENTOLIN HFA) 108 (90 Base) MCG/ACT inhaler Inhale into the lungs every 6 (six) hours as needed for wheezing or shortness of breath.    [provider]  cholecalciferol (VITAMIN D) 1000 units tablet Take 1,000 Units by mouth daily.    [provider]  ibuprofen (ADVIL,MOTRIN) 800 MG tablet Take 1 tablet (800 mg total) by mouth every 8 (eight) hours as needed for moderate pain. 07/01/18   Fayrene Helper, PA-C  lurasidone (LATUDA) 40 MG TABS tablet Take 40 mg by mouth daily with breakfast.    [provider]  meloxicam (MOBIC) 7.5 MG tablet Take 1 tablet (7.5 mg total) by mouth 2 (two) times daily as needed for pain. 12/07/19   Levert Feinstein, MD  Multiple Vitamin (MULTIVITAMIN WITH MINERALS) TABS tablet Take 1 tablet by mouth daily.    [provider]  naproxen (NAPROSYN) 500 MG tablet Take 1 tablet (500 mg total) by mouth 2 (two) times daily. 10/30/19   Hall-Potvin,  [place], PA-C  vitamin B-12 (CYANOCOBALAMIN) 1000 MCG tablet Take 1,000 mcg by mouth daily.    [provider]  vitamin C (ASCORBIC ACID) 250 MG tablet Take 250 mg by mouth daily.    [provider]      Allergies    Patient has no known allergies.    Review of Systems   Review of Systems  Constitutional:  Positive for chills, fatigue and fever (subjective). Negative for diaphoresis.  HENT:  Positive for congestion and sore throat. Negative for ear pain.   Eyes:  Negative for visual disturbance.  Respiratory:  Positive for cough. Negative for shortness of breath and wheezing.   Cardiovascular:  Negative for chest pain.  Gastrointestinal:  Positive for nausea. Negative for abdominal pain, constipation, diarrhea and vomiting.  Genitourinary:  Positive for vaginal pain. Negative for flank pain, frequency, pelvic pain, vaginal bleeding and vaginal discharge.  Musculoskeletal:  Positive for myalgias. Negative for back pain, neck pain and neck stiffness.  Skin:  Negative for rash and wound.  Neurological:  Negative for loss of consciousness, weakness, light-headedness, numbness and headaches (no pain).  Psychiatric/Behavioral:  Negative for agitation and confusion.   All other systems reviewed and are negative.   Physical Exam Updated Vital Signs BP (!) 141/86   Pulse 62   Temp 98.1 F (36.7 C) (Oral)   Resp 12   LMP 06/21/2014   SpO2 98%  Physical Exam Vitals and nursing note reviewed. Exam conducted with a chaperone present.  Constitutional:  General: She is not in acute distress.    Appearance: She is well-developed. She is not ill-appearing, toxic-appearing or diaphoretic.  HENT:     Head: Normocephalic and atraumatic.     Nose: No congestion or rhinorrhea.     Mouth/Throat:     Mouth: Mucous membranes are moist.     Pharynx: No oropharyngeal exudate or posterior oropharyngeal erythema.  Eyes:     Extraocular Movements: Extraocular movements intact.      Conjunctiva/sclera: Conjunctivae normal.     Pupils: Pupils are equal, round, and reactive to light.  Cardiovascular:     Rate and Rhythm: Normal rate and regular rhythm.     Heart sounds: No murmur heard. Pulmonary:     Effort: Pulmonary effort is normal. No respiratory distress.     Breath sounds: Normal breath sounds. No wheezing, rhonchi or rales.  Chest:     Chest wall: No tenderness.  Abdominal:     General: Abdomen is flat.     Palpations: Abdomen is soft.     Tenderness: There is no abdominal tenderness. There is no right CVA tenderness, left CVA tenderness, guarding or rebound.  Genitourinary:    Labia:        Right: No rash or tenderness.        Left: No rash or tenderness.      Vagina: No tenderness.     Cervix: Discharge present. No cervical motion tenderness, friability or erythema.     Uterus: Not tender.      Adnexa:        Right: No tenderness.         Left: No tenderness.    Musculoskeletal:        General: No swelling or tenderness.     Cervical back: Neck supple. No tenderness.     Right lower leg: No edema.     Left lower leg: No edema.  Skin:    General: Skin is warm and dry.     Capillary Refill: Capillary refill takes less than 2 seconds.     Findings: No erythema or rash.  Neurological:     General: No focal deficit present.     Mental Status: She is alert.     Sensory: No sensory deficit.     Motor: No weakness.  Psychiatric:        Mood and Affect: Mood normal.     ED Results / Procedures / Treatments   Labs (all labs ordered are listed, but only abnormal results are displayed) Labs Reviewed  SARS CORONAVIRUS 2 BY RT PCR - Abnormal; Notable for the following components:      Result Value   SARS Coronavirus 2 by RT PCR POSITIVE (*)    All other components within normal limits  CBC WITH DIFFERENTIAL/PLATELET - Abnormal; Notable for the following components:   Hemoglobin 15.7 (*)    HCT 46.3 (*)    All other components within normal  limits  COMPREHENSIVE METABOLIC PANEL - Abnormal; Notable for the following components:   Glucose, Bld 67 (*)    All other components within normal limits  WET PREP, GENITAL  GROUP A STREP BY PCR  URINALYSIS, ROUTINE W REFLEX MICROSCOPIC  PREGNANCY, URINE  HIV ANTIBODY (ROUTINE TESTING W REFLEX)  TSH  RPR  GC/CHLAMYDIA PROBE AMP ([place]) NOT AT [facility]    EKG None  Radiology DG Chest 2 View  Result Date: 05/24/2022 CLINICAL DATA:  Cough, fatigue EXAM: CHEST - 2 VIEW COMPARISON:  11/03/2019 FINDINGS: The heart size and mediastinal contours are within normal limits. Both lungs are clear. The visualized skeletal structures are unremarkable. IMPRESSION: No active cardiopulmonary disease. Electronically Signed   By: Duanne Guess D.O.   On: 05/24/2022 13:12    Procedures Procedures    Medications Ordered in ED Medications  ondansetron (ZOFRAN) injection 4 mg (has no administration in time range)  sodium chloride 0.9 % bolus 1,000 mL (0 mLs Intravenous Stopped 05/24/22 1449)    ED Course/ Medical Decision Making/ A&P                           Medical Decision Making Amount and/or Complexity of Data Reviewed Labs: ordered. Radiology: ordered.  Risk Prescription drug management.    Nancy Chang is a 56 y.o. female with a past medical history significant for asthma and previous sexual transmitted infections who presents with concern for sexual transmitted infection and a constellation of symptoms including subjective fevers, chills, congestion, sore throat, cough, malaise, and vaginal pain.  According to patient, she had a sexual encounter with a recurrent partner 2 weeks ago and for the last week she has been overcome with illness.  She reports feeling drained, fatigued, tired, some sore throat with congestion.  She also reports some cough, myalgias, and some vaginal discomfort.  She is not in any vaginal bleeding or vaginal discharge and denies any urinary changes.  She  denies any constipation or diarrhea.  Patient is very concerned about an STI or even something more systemic such as HIV.  Patient wants to be tested and evaluated.  On my exam, lungs were clear and chest was nontender.  Abdomen was nontender.  Patient moving all extremities.  Patient had dry mucous membranes.  Pelvic exam will be performed with a chaperone.  No focal neurologic deficits.  No neck stiffness on my exam.  Pupils are symmetric and reactive known extract movements.  Patient is denying headaches at this time.  Clinically I do suspect patient could have either a viral infection such as COVID or an STD however with her report of the systemic symptoms, we will also get HIV testing after our discussion.  We will also get RPR testing and do a pelvic exam to get swabs.  Anticipate empiric treatment with antibiotics for possible STI exposure with vaginal discomfort but will wait for HIV test to return.  We will give her fluids and nausea medicine and let her rest.  Anticipate reassessment after work-up.  Pelvic exam was performed with some discharge but no cervical motion tenderness or adnexal tenderness.  Anticipate treatment with empiric antibiotics for possible STI exposure.  Will await further labs returned.  She started to feel better with some rehydration.  While awaiting the results of her work-up, patient eloped from the emergency department.  9:10 PM Several hours later, her COVID test came back positive.  I called her back to tell her this result and this is likely the cause of her symptoms.  Her HIV test was negative and her wet prep was negative.  We discussed that if she would not stay we could empirically treat her with medications for possible STI exposure but she will follow-up on the MyChart and with her PCP.  Glucose was also low which we told her about she reports she was feeling hungry and went home to eat and is now feeling better.  She understands return precautions and  follow-up instructions and eloped in  good condition.         Final Clinical Impression(s) / ED Diagnoses Final diagnoses:  COVID-19     Clinical Impression: 1. COVID-19     Disposition: Eloped  Condition: Good  I have discussed the results, Dx and Tx plan with the pt(& family if present). He/she/they expressed understanding and agree(s) with the plan. Discharge instructions discussed at great length. Strict return precautions discussed and pt &/or family have verbalized understanding of the instructions. No further questions at time of discharge.    Discharge Medication List as of 05/24/2022  4:58 PM      Follow Up: No follow-up provider specified.     Levon Boettcher, Canary Brim, MD 05/24/22 2111

## 2022-05-25 LAB — RPR: RPR Ser Ql: NONREACTIVE

## 2022-05-26 LAB — GC/CHLAMYDIA PROBE AMP (~~LOC~~) NOT AT ARMC
Chlamydia: NEGATIVE
Comment: NEGATIVE
Comment: NORMAL
Neisseria Gonorrhea: NEGATIVE

## 2023-11-02 ENCOUNTER — Ambulatory Visit
Admission: EM | Admit: 2023-11-02 | Discharge: 2023-11-02 | Disposition: A | Discharge status: Home / Self Care | Payer: No Typology Code available for payment source

## 2023-11-02 ENCOUNTER — Encounter: Payer: Self-pay | Admitting: Emergency Medicine

## 2023-11-02 DIAGNOSIS — J069 Acute upper respiratory infection, unspecified: Secondary | ICD-10-CM | POA: Diagnosis not present

## 2023-11-02 HISTORY — DX: Emphysema, unspecified: J43.9

## 2023-11-02 MED ORDER — PREDNISONE 10 MG (21) PO TBPK: ORAL_TABLET | Freq: Every day | ORAL | 0 refills | Status: AC

## 2023-11-02 MED ORDER — BENZONATATE 100 MG PO CAPS: 100.0000 mg | ORAL_CAPSULE | Freq: Three times a day (TID) | ORAL | 0 refills | Status: AC

## 2023-11-02 MED ORDER — AMOXICILLIN-POT CLAVULANATE 875-125 MG PO TABS: 1.0000 | ORAL_TABLET | Freq: Two times a day (BID) | ORAL | 0 refills | Status: AC

## 2023-11-02 NOTE — ED Provider Notes (Addendum)
 [facility]    CSN: [id number] Arrival date & time: 11/02/23  1110      History   Chief Complaint Chief Complaint  Patient presents with   Cough   Nasal Congestion   Sore Throat    HPI Nancy Chang is a 58 y.o. female.   Patient presents for evaluation of nasal congestion, rhinorrhea, sore throat and a productive cough present for 6 to 8 weeks, over the past 3 weeks has begun to experience a sore throat, worsening in severity.  Was experiencing shortness of breath and wheezing but symptoms greatly improved with use of albuterol inhaler, history of asthma and emphysema.  Denies fever, ear pain or abdominal symptoms.  Tolerating food and liquids.  Been taking over-the-counter cold and flu medications.    Past Medical History:  Diagnosis Date   Asthma    Emphysema, unspecified (HCC)     Patient Active Problem List   Diagnosis Date Noted   Chronic bilateral low back pain without sciatica 12/07/2019   Upper back pain 12/07/2019   Abnormal brain scan 12/07/2019    Past Surgical History:  Procedure Laterality Date   CESAREAN SECTION     x5   FOOT SURGERY Left    IR ANGIO INTRA EXTRACRAN SEL COM CAROTID INNOMINATE BILAT MOD SED  11/18/2019   IR ANGIO VERTEBRAL SEL VERTEBRAL BILAT MOD SED  11/18/2019    OB History   No obstetric history on file.      Home Medications    Prior to Admission medications   Medication Sig Start Date End Date Taking? Authorizing Provider  aspirin 81 MG chewable tablet Chew by mouth. 09/25/15  Yes [provider]  cetirizine (ZYRTEC) 10 MG tablet Take by mouth. 03/02/23   [provider]  Cholecalciferol 50 MCG (2000 UT) TABS Take 1 tablet by mouth daily. 03/23/23  Yes [provider]  escitalopram (LEXAPRO) 20 MG tablet Take by mouth. 06/05/23  Yes [provider]  fluticasone (FLONASE) 50 MCG/ACT nasal spray INSTILL 1 SPRAY IN EACH NOSTRIL TWICE A DAY FOR NASAL CONGESTION 12/02/22 12/03/23 Yes  [provider]  albuterol (VENTOLIN HFA) 108 (90 Base) MCG/ACT inhaler Inhale into the lungs every 6 (six) hours as needed for wheezing or shortness of breath.   Yes [provider]  cholecalciferol (VITAMIN D) 1000 units tablet Take 1,000 Units by mouth daily.   Yes [provider]  ibuprofen (ADVIL,MOTRIN) 800 MG tablet Take 1 tablet (800 mg total) by mouth every 8 (eight) hours as needed for moderate pain. 07/01/18  Yes Fayrene Helper, PA-C  lurasidone (LATUDA) 40 MG TABS tablet Take 40 mg by mouth daily with breakfast. Patient not taking: Reported on 11/02/2023    [provider]  meloxicam (MOBIC) 7.5 MG tablet Take 1 tablet (7.5 mg total) by mouth 2 (two) times daily as needed for pain. Patient not taking: Reported on 11/02/2023 12/07/19   Levert Feinstein, MD  Multiple Vitamin (MULTIVITAMIN WITH MINERALS) TABS tablet Take 1 tablet by mouth daily.   Yes [provider]  naproxen (NAPROSYN) 500 MG tablet Take 1 tablet (500 mg total) by mouth 2 (two) times daily. Patient not taking: Reported on 11/02/2023 10/30/19   Hall-Potvin, [place], PA-C  vitamin B-12 (CYANOCOBALAMIN) 1000 MCG tablet Take 1,000 mcg by mouth daily.   Yes [provider]  vitamin C (ASCORBIC ACID) 250 MG tablet Take 250 mg by mouth daily.   Yes [provider]    Family History  History reviewed. No pertinent family history.  Social History Social History   Tobacco Use   Smoking status: Former    Current packs/day: 1.00    Types: Cigarettes   Smokeless tobacco: Never  Vaping Use   Vaping status: Some Days  Substance Use Topics   Alcohol use: No   Drug use: No     Allergies   Patient has no known allergies.   Review of Systems Review of Systems  Constitutional: Negative.   HENT:  Positive for congestion, rhinorrhea and sore throat. Negative for dental problem, drooling, ear discharge, ear pain, facial swelling, hearing loss, mouth sores, nosebleeds,  postnasal drip, sinus pressure, sinus pain, sneezing, tinnitus, trouble swallowing and voice change.   Respiratory:  Positive for cough. Negative for apnea, choking, chest tightness, shortness of breath, wheezing and stridor.   Cardiovascular: Negative.   Gastrointestinal: Negative.      Physical Exam Triage Vital Signs ED Triage Vitals  Encounter Vitals Group     BP 11/02/23 1340 (!) 136/94     Systolic BP Percentile --      Diastolic BP Percentile --      Pulse Rate 11/02/23 1340 69     Resp 11/02/23 1340 18     Temp 11/02/23 1340 97.6 F (36.4 C)     Temp Source 11/02/23 1340 Oral     SpO2 11/02/23 1340 98 %     Weight --      Height --      Head Circumference --      Peak Flow --      Pain Score 11/02/23 1341 6     Pain Loc --      Pain Education --      Exclude from Growth Chart --    No data found.  Updated Vital Signs BP (!) 136/94 (BP Location: Left Arm)   Pulse 69   Temp 97.6 F (36.4 C) (Oral)   Resp 18   LMP 06/21/2014   SpO2 98%   Visual Acuity Right Eye Distance:   Left Eye Distance:   Bilateral Distance:    Right Eye Near:   Left Eye Near:    Bilateral Near:     Physical Exam Constitutional:      Appearance: Normal appearance.  HENT:     Head: Normocephalic.     Right Ear: Tympanic membrane, ear canal and external ear normal.     Left Ear: Tympanic membrane, ear canal and external ear normal.     Nose: Congestion present. No rhinorrhea.     Mouth/Throat:     Mouth: Mucous membranes are moist.     Pharynx: Posterior oropharyngeal erythema present. No oropharyngeal exudate.  Eyes:     Extraocular Movements: Extraocular movements intact.  Cardiovascular:     Rate and Rhythm: Normal rate and regular rhythm.     Pulses: Normal pulses.     Heart sounds: Normal heart sounds.  Pulmonary:     Effort: Pulmonary effort is normal.     Breath sounds: Wheezing present.  Neurological:     Mental Status: She is alert and oriented to person, place,  and time. Mental status is at baseline.      UC Treatments / Results  Labs (all labs ordered are listed, but only abnormal results are displayed) Labs Reviewed - No data to display  EKG   Radiology No results found.  Procedures Procedures (including critical care time)  Medications Ordered in UC Medications - No data  to display  Initial Impression / Assessment and Plan / UC Course  I have reviewed the triage vital signs and the nursing notes.  Pertinent labs & imaging results that were available during my care of the patient were reviewed by me and considered in my medical decision making (see chart for details).  Acute URI  Patient is in no signs of distress nor toxic appearing.  Vital signs are stable.  Low suspicion for pneumonia, pneumothorax or bronchitis and therefore will defer imaging.  Symptoms present for 6 to 8 weeks, wheezing heard to auscultation, O2 saturation 98%, stable for outpatient management.  Prescribed Augmentin, prednisone and Tessalon.May use additional over-the-counter medications as needed for supportive care.  May follow-up with urgent care as needed if symptoms persist or worsen.    Final diagnoses:  None   Discharge Instructions   None    ED Prescriptions   None    PDMP not reviewed this encounter.   Valinda Hoar, NP 11/02/23 1401    Valinda Hoar, NP 11/02/23 1401

## 2023-11-02 NOTE — ED Triage Notes (Signed)
 Pt reports congestion and productive cough since Dec 2024. Reports it has gotten worse with new sore throat x2-3 weeks ago. Pt has been taken OTC cold meds with some relief. Denies fevers at home.

## 2023-11-02 NOTE — Discharge Instructions (Signed)
 Begin Augmentin every morning and every evening for 10 days to clear bacteria which is most likely contributing symptoms and causing them to linger  Begin use of prednisone every morning as directed to open and relax the airway, should fully settle shortness of breath and wheezing  May use Tessalon pill every 8 hours as needed for cough    You can take Tylenol and/or Ibuprofen as needed for fever reduction and pain relief.   For cough: honey 1/2 to 1 teaspoon (you can dilute the honey in water or another fluid).  You can also use guaifenesin and dextromethorphan for cough. You can use a humidifier for chest congestion and cough.  If you don't have a humidifier, you can sit in the bathroom with the hot shower running.      For sore throat: try warm salt water gargles, cepacol lozenges, throat spray, warm tea or water with lemon/honey, popsicles or ice, or OTC cold relief medicine for throat discomfort.   For congestion: take a daily anti-histamine like Zyrtec, Claritin, and a oral decongestant, such as pseudoephedrine.  You can also use Flonase 1-2 sprays in each nostril daily.   It is important to stay hydrated: drink plenty of fluids (water, gatorade/powerade/pedialyte, juices, or teas) to keep your throat moisturized and help further relieve irritation/discomfort.

## 2024-03-11 ENCOUNTER — Encounter (HOSPITAL_COMMUNITY): Payer: Self-pay | Admitting: Interventional Radiology
